# Patient Record
Sex: Male | Born: 1990 | Race: Black or African American | Hispanic: No | Marital: Single | Smoking: Never smoker
Health system: Southern US, Community
[De-identification: ages and names within clinical notes are randomized; demographics above are authoritative.]

---

## 2014-05-04 ENCOUNTER — Emergency Department (HOSPITAL_COMMUNITY)
Admission: EM | Admit: 2014-05-04 | Discharge: 2014-05-04 | Disposition: A | Discharge status: Home / Self Care | Payer: BLUE CROSS/BLUE SHIELD | Source: Home / Self Care | Attending: Family Medicine | Admitting: Family Medicine

## 2014-05-04 ENCOUNTER — Ambulatory Visit (HOSPITAL_COMMUNITY): Payer: BLUE CROSS/BLUE SHIELD | Attending: Family Medicine

## 2014-05-04 ENCOUNTER — Encounter (HOSPITAL_COMMUNITY): Payer: Self-pay | Admitting: Emergency Medicine

## 2014-05-04 DIAGNOSIS — M84375A Stress fracture, left foot, initial encounter for fracture: Secondary | ICD-10-CM

## 2014-05-04 DIAGNOSIS — M79673 Pain in unspecified foot: Secondary | ICD-10-CM | POA: Diagnosis not present

## 2014-05-04 NOTE — ED Provider Notes (Signed)
Jesus Taylor is a 24 y.o. male who presents to Urgent Care today for left foot pain and swelling. Patient has a week of pain and swelling of his left distal foot. Patient denies any injury or change in work at routine. He works at [employer] walking on a concrete floor all day. Pain is worse with activity and better with rest. He has not tried any medications yet. No fevers or chills.   History reviewed. No pertinent past medical history. History reviewed. No pertinent past surgical history. History  Substance Use Topics  . Smoking status: Never Smoker   . Smokeless tobacco: Not on file  . Alcohol Use: Yes     Comment: occaisonal    ROS as above Medications: No current facility-administered medications for this encounter.   No current outpatient prescriptions on file.   No Known Allergies   Exam:  BP 122/66 mmHg  Pulse 64  Temp(Src) 97.9 F (36.6 C) (Oral)  Resp 12  SpO2 100% Gen: Well NAD Left leg:  Knee is nontender with normal motion ankle nontender normal motion normal appearing Foot swollen and tender at the distal third and fourth metatarsals Pulses capillary refill sensation intact Motion intact.  Contralateral right leg Knee nontender normal motion and slight ankle nontender normal motion Foot nontender normal motion Pulses capillary refill sensation intact distally.  No results found for this or any previous visit (from the past 24 hour(s)). Dg Foot Complete Left  05/04/2014   CLINICAL DATA:  Visually shin pain anterior left foot for 1 week, no known injury, possible stress fracture metatarsal  EXAM: LEFT FOOT - COMPLETE 3+ VIEW  COMPARISON:  None.  FINDINGS: Third metatarsal shows periosteal reaction measuring about 7 mm in length, involving the medial aspect of the mid shaft of the third metatarsal. There is an extremely subtle linear lucency involving the medial cortex and extending into the shaft.  IMPRESSION: The findings are consistent with a nondisplaced  stress fracture of the third metatarsal shaft.   Electronically Signed   By: Esperanza Heiraymond  Rubner M.D.   On: 05/04/2014 12:03    Assessment and Plan: 24 y.o. male with third metatarsal stress fracture. Treat with postoperative shoe or steel shank. Follow up with sports medicine in about 2 weeks.  Discussed warning signs or symptoms. Please see discharge instructions. Patient expresses understanding.     Rodolph BongEvan S Emilo Gras, MD 05/04/14 1225

## 2014-05-04 NOTE — ED Notes (Signed)
Pt states that his left foot has been hurting, pt denies any injury or fall.

## 2014-05-04 NOTE — ED Notes (Signed)
Discussed discharge instructions with pt. Pt verbalized understanding of all discharge information.

## 2014-05-04 NOTE — Discharge Instructions (Signed)
Thank you for coming in today. Got get Turf Toe Full Steel Insole for your boots at work. [email or web address]-toe-full-steel-insole.html Follow up with Dr. Katrinka Blazing in about 2 weeks.  Return as needed.   Metatarsal Stress Fracture A stress fracture is a break in a bone of the body that is caused by repeated stress (trauma) that slowly weakens the bone until it eventually breaks. The metatarsal bones are in the middle of the feet, connecting the toes to the ankle. They are vulnerable to stress fractures. Metatarsal stress fractures are the second most common type of stress fracture in athletes. The metatarsal of the pointer toe (second metatarsal) is the most common metatarsal to suffer a stress fracture. SYMPTOMS   Vague, spread out pain or ache. Sometimes, tenderness and swelling in the foot.  Uncommonly, bleeding and bruising in the foot.  Weakness and inability to bear weight on the injured foot.  Paleness and deformity (sometimes). CAUSES  A stress fracture is caused by repeated trauma. This slowly weakens the bone, faster than it can heal itself, until the bone breaks. Stress fractures often follow a sudden change in training schedule. Stress fractures may be related to the loss of menstrual period in women.  RISK INCREASES WITH:   Previous stress fracture.  Sudden changes in training intensity, frequency, or duration [employer] conservation officer recruits, distance runners).  Bony abnormalities (osteoporosis, tumors).  Metabolism disorders or hormone problems.  Nutrition deficiencies or eating disorders (anorexia or bulimia).  The loss of or irregular menstrual periods in women.  Poor strength and flexibility.  Running on hard surfaces. Poor leg and foot alignment. This includes flat feet.  Poor footwear with poor shock absorbers.  Poor running technique. PREVENTION   Warm up and stretch properly before activity.  Maintain physical fitness:  Muscle strength.  Endurance  and flexibility.  Wear proper and correctly fitted footwear. Replace shoes after 300 to 500 miles of running.  Learn and use proper technique with training and activity.  Increase activity and training gradually.  Treat hormonal disorders. Birth control pills can be helpful for women with menstrual period irregularity.  Correct metabolism and nutrition disorders.  Wear cushioned arch supports for runners with flat feet. PROGNOSIS  With proper treatment, stress fractures usually heal within 6 to 12 weeks. RELATED COMPLICATIONS   Failure to heal (nonunion), especially with stress fractures of the outer foot (upper part of the fifth metatarsal).  Healing in a poor position (malunion).  Recurring stress fracture.  Progression to a complete or displaced fracture.  Risks of surgery: infection, bleeding, injury to nerves (numbness, weakness, paralysis), and need for further surgery.  Repeated stress fracture, not necessarily at the same site. (Occurs in 1 of every 10 patients). TREATMENT Treatment first involves ice and medicine to reduce pain and inflammation. You must rest from any aggravating activity, to avoid making the fracture worse. For severe stress fractures, crutches may be advised, to take weight off the injured foot. Depending on your caregiver's instructions, you may be permitted to perform activities that do not cause pain. Any menstrual, hormonal, or nutritional problems must be addressed and treated. Return to activity must be performed gradually, to avoid reinjuring the foot. Physical therapy may be advised, to help strengthen the foot and regain full function. On rare occasions, surgery is needed. This may be offered if non-surgical treatment is ineffective after 3 to 6 months. MEDICATION   If pain medicine is needed, nonsteroidal anti-inflammatory medicines (NSAIDS) or other minor pain relievers are often advised.  Do  not take pain medicine for 7 days before  surgery.  Only take over-the-counter or prescription medicines for pain, discomfort, or fever as directed by your caregiver. SEEK IMMEDIATE MEDICAL CARE IF:  Symptoms get worse or do not improve in 2 weeks, despite treatment. The following occur after immobilization or surgery:  Swelling above or below the fracture site.  Severe, persistent pain.  Blue or gray skin below the fracture site, especially under the toenails. Numbness or loss of feeling below the fracture site.  New, unexplained symptoms develop. (Drugs used in treatment may produce side effects.) Document Released: 02/10/2005 Document Revised: 05/05/2011 Document Reviewed: 05/25/2008 [facility] Patient Information 2015 [employer]. This information is not intended to replace advice given to you by your health care provider. Make sure you discuss any questions you have with your health care provider.

## 2014-05-25 ENCOUNTER — Ambulatory Visit: Payer: BLUE CROSS/BLUE SHIELD | Admitting: Family Medicine

## 2014-05-31 ENCOUNTER — Ambulatory Visit: Payer: BLUE CROSS/BLUE SHIELD | Admitting: Family Medicine

## 2014-06-19 ENCOUNTER — Other Ambulatory Visit (INDEPENDENT_AMBULATORY_CARE_PROVIDER_SITE_OTHER): Payer: BLUE CROSS/BLUE SHIELD

## 2014-06-19 ENCOUNTER — Ambulatory Visit (INDEPENDENT_AMBULATORY_CARE_PROVIDER_SITE_OTHER): Payer: BLUE CROSS/BLUE SHIELD | Admitting: Family Medicine

## 2014-06-19 ENCOUNTER — Encounter: Payer: Self-pay | Admitting: Family Medicine

## 2014-06-19 VITALS — BP 100/70 | HR 64 | Ht 69.0 in | Wt 149.0 lb

## 2014-06-19 DIAGNOSIS — M84375A Stress fracture, left foot, initial encounter for fracture: Secondary | ICD-10-CM | POA: Diagnosis not present

## 2014-06-19 DIAGNOSIS — M79672 Pain in left foot: Secondary | ICD-10-CM | POA: Diagnosis not present

## 2014-06-19 NOTE — Progress Notes (Signed)
[facility] [address] [place], [place] [address] Phone: [phone] Subjective:    CC: foot pain  Jesus Taylor is a 24 y.o. male coming in with complaint of patient was seen 6 weeks ago in urgent care and was diagnosed with a left foot metatarsal stress fracture. As seen on x-ray of the third metatarsal.patient was put in a postop boot and discussed a follow-up. Patient states since then has made some moderate improvement. Patient will continue to work as well as continue to do the exercises. Patient denies any numbness or [place]. Still has some mild soreness at the end of long days but nothing like it was previously. No swelling. Patient states that he feels that the pain should be completely better at this time. Patient rates the severity of pain is 3 out of 10. Denies any nighttime awakening.  No past medical history on file. No past surgical history on file. No Known Allergies History  Substance Use Topics  . Smoking status: Never Smoker   . Smokeless tobacco: Not on file  . Alcohol Use: Yes     Comment: occaisonal    No family history on file.      Past medical history, social, surgical and family history all reviewed in electronic medical record.   Review of Systems: No headache, visual changes, nausea, vomiting, diarrhea, constipation, dizziness, abdominal pain, skin rash, fevers, chills, night sweats, weight loss, swollen lymph nodes, body aches, joint swelling, muscle aches, chest pain, shortness of breath, mood changes.   Objective Blood pressure 100/70, pulse 64, height  (1.753 m), weight 149 lb (67.586 kg), SpO2 99 %.  General: No apparent distress alert and oriented x3 mood and affect normal, dressed appropriately.  HEENT: Pupils equal, extraocular movements intact  Respiratory: Patient's speak in full sentences and does not appear short of breath  Cardiovascular: No lower extremity edema, non tender, no  erythema  Skin: Warm dry intact with no signs of infection or rash on extremities or on axial skeleton.  Abdomen: Soft nontender  Neuro: Cranial nerves II through XII are intact, neurovascularly intact in all extremities with 2+ DTRs and 2+ pulses.  Lymph: No lymphadenopathy of posterior or anterior cervical chain or axillae bilaterally.  Gait normal with good balance and coordination.  MSK:  Non tender with full range of motion and good stability and symmetric strength and tone of shoulders, elbows, wrist, hip, knees bilaterally.  Ankle:left No visible erythema or swelling. Range of motion is full in all directions. Strength is 5/5 in all directions. Stable lateral and medial ligaments; squeeze test and kleiger test unremarkable; Talar dome nontender; No pain at base of 5th MT; No tenderness over cuboid; No tenderness over N spot or navicular prominence No tenderness on posterior aspects of lateral and medial malleolus No sign of peroneal tendon subluxations or tenderness to palpation Negative tarsal tunnel tinel's Able to walk 4 steps. Foot exam shows the patient does have a Morton's toe. Patient does have breakdown with splaying between the second and third toes. Patient is minimally tender to palpation over the left third metatarsal distally. Neurovascular intact distally.  MSK US performed QI:ONGE This study was ordered, performed, and interpreted by Jesus Taylor D.O.  Foot/Ankle:   All structures visualized.   Talar dome unremarkable  Ankle mortise without effusion. Peroneus longus and brevis tendons unremarkable on long and transverse views without sheath effusions. Posterior tibialis, flexor hallucis longus, and flexor digitorum longus tendons unremarkable on  long and transverse views without sheath effusions. Achilles tendon visualized along length of tendon and unremarkable on long and transverse views without sheath effusion. Anterior Talofibular Ligament and Calcaneofibular  Ligaments unremarkable and intact. Deltoid Ligament unremarkable and intact. Plantar fascia intact and without effusion, normal thickness. No increased doppler signal, cap sign, or thickening of tibial cortex. Power doppler signal normal. Third metatarsal on left foot does still have some mild callus formation. Patient does have hypoechoic changes and increasing Doppler flow.  IMPRESSION:  Continued third metatarsal stress fracture with some interval healing less than what would be appreciated     Impression and Recommendations:     This case required medical decision making of moderate complexity.

## 2014-06-19 NOTE — Assessment & Plan Note (Signed)
HEP, icing, discussed breakdown of foot and what to avoid, change workout routine and avoid excessive standing when possible.  RTC in 3 weeks.

## 2014-06-19 NOTE — Progress Notes (Signed)
Pre visit review using our clinic review tool, if applicable. No additional management support is needed unless otherwise documented below in the visit note. 

## 2014-06-19 NOTE — Patient Instructions (Signed)
Good to see you.  Ice 20 minutes 2 times daily. Usually after activity and before bed. Exercises 3 times a week to keep foot good Spenco orthotic looking for "total support" Rigid sole shoe daily, no barefoot Vitamin D 4000 IU daily for next 2 weeks then 2000 IU daily Ok to bike and elliptical no jumping, ok to lift.  See me again in 3 weeks to make sure completely good.

## 2014-07-10 ENCOUNTER — Ambulatory Visit (INDEPENDENT_AMBULATORY_CARE_PROVIDER_SITE_OTHER): Payer: BLUE CROSS/BLUE SHIELD | Admitting: Family Medicine

## 2014-07-10 ENCOUNTER — Encounter: Payer: Self-pay | Admitting: Family Medicine

## 2014-07-10 VITALS — BP 104/64 | HR 55 | Ht 69.0 in | Wt 148.0 lb

## 2014-07-10 DIAGNOSIS — M84375D Stress fracture, left foot, subsequent encounter for fracture with routine healing: Secondary | ICD-10-CM | POA: Diagnosis not present

## 2014-07-10 NOTE — Progress Notes (Signed)
  Tawana ScaleZach Smith D.O.  [facility] [address] [place], [place] [address] Phone: [phone] Subjective:    CC: foot pain  ZSW:[id number]:Subjective Jesus Taylor is a 24 y.o. male coming in with complaint of patient was seen 6 weeks ago in urgent care and was diagnosed with a left foot metatarsal stress fracture. As seen on x-ray of the third metatarsal.patient was put in a postop boot and discussed a follow-up.  Patient states now he is feeling much better. Patient has been taking the vitamin D supplementation. Patient has been wearing regular shoes and feeling much better. Patient actually states that he is having no pain. He would state 100  percent better.  No past medical history on file. No past surgical history on file. No Known Allergies History  Substance Use Topics  . Smoking status: Never Smoker   . Smokeless tobacco: Not on file  . Alcohol Use: Yes     Comment: occaisonal    No family history on file.      Past medical history, social, surgical and family history all reviewed in electronic medical record.   Review of Systems: No headache, visual changes, nausea, vomiting, diarrhea, constipation, dizziness, abdominal pain, skin rash, fevers, chills, night sweats, weight loss, swollen lymph nodes, body aches, joint swelling, muscle aches, chest pain, shortness of breath, mood changes.   Objective Blood pressure 104/64, pulse 55, height 5\' 9"  (1.753 m), weight 148 lb (67.132 kg), SpO2 98 %.  General: No apparent distress alert and oriented x3 mood and affect normal, dressed appropriately.  HEENT: Pupils equal, extraocular movements intact  Respiratory: Patient's speak in full sentences and does not appear short of breath  Cardiovascular: No lower extremity edema, non tender, no erythema  Skin: Warm dry intact with no signs of infection or rash on extremities or on axial skeleton.  Abdomen: Soft nontender  Neuro: Cranial nerves II through XII are intact,  neurovascularly intact in all extremities with 2+ DTRs and 2+ pulses.  Lymph: No lymphadenopathy of posterior or anterior cervical chain or axillae bilaterally.  Gait normal with good balance and coordination.  MSK:  Non tender with full range of motion and good stability and symmetric strength and tone of shoulders, elbows, wrist, hip, knees bilaterally.  Ankle:left No visible erythema or swelling. Range of motion is full in all directions. Strength is 5/5 in all directions. Stable lateral and medial ligaments; squeeze test and kleiger test unremarkable; Talar dome nontender; No pain at base of 5th MT; No tenderness over cuboid; No tenderness over N spot or navicular prominence No tenderness on posterior aspects of lateral and medial malleolus No sign of peroneal tendon subluxations or tenderness to palpation Negative tarsal tunnel tinel's Able to walk 4 steps. Foot exam shows the patient does have a Morton's toe. Patient does have breakdown with splaying between the second and third toes. nontender on exam. Neurovascular intact distally.       Impression and Recommendations:     This case required medical decision making of moderate complexity.

## 2014-07-10 NOTE — Patient Instructions (Signed)
Verbal instructions given

## 2014-07-10 NOTE — Assessment & Plan Note (Signed)
Healed at this time, no restrictions, follow-up as needed.

## 2017-01-22 ENCOUNTER — Encounter (HOSPITAL_COMMUNITY): Payer: Self-pay | Admitting: Emergency Medicine

## 2017-01-22 ENCOUNTER — Other Ambulatory Visit: Payer: Self-pay

## 2017-01-22 ENCOUNTER — Ambulatory Visit (HOSPITAL_COMMUNITY)
Admission: EM | Admit: 2017-01-22 | Discharge: 2017-01-22 | Disposition: A | Discharge status: Home / Self Care | Payer: BLUE CROSS/BLUE SHIELD | Attending: Physician Assistant | Admitting: Physician Assistant

## 2017-01-22 DIAGNOSIS — R42 Dizziness and giddiness: Secondary | ICD-10-CM

## 2017-01-22 DIAGNOSIS — K219 Gastro-esophageal reflux disease without esophagitis: Secondary | ICD-10-CM

## 2017-01-22 MED ORDER — OMEPRAZOLE 20 MG PO CPDR: 40.0000 mg | DELAYED_RELEASE_CAPSULE | Freq: Every day | ORAL | 0 refills | Status: DC

## 2017-01-22 NOTE — ED Triage Notes (Signed)
Pt also stated he had an episode of lightheadeness and tingling in L hand. Pt denies these symptoms at present.

## 2017-01-22 NOTE — ED Triage Notes (Signed)
Pt c/o acid reflux x2-3 weeks. Pt took some OTC med for it today and its not hurting as bad.

## 2017-01-22 NOTE — ED Provider Notes (Signed)
[facility]    CSN: [id number] Arrival date & time: 01/22/17  1943     History   Chief Complaint Chief Complaint  Patient presents with  . Gastroesophageal Reflux  . Dizziness    HPI Jesus Taylor is a 26 y.o. male.   26 year-old male, presenting today to acid reflux. States that over the past 2-3 days, he has had "gas" in his stomach that travels up into his chest. States that this worsens after eating things with tomato in it such as pizza, spaghetti, meatloaf. Today, he took left over prilosec with mild relief.  States that with the onset of his symptoms, he had bubbling in his stomach that caused chest pain. He called 911 because he thought he was having a "mild heart attack". These symptoms lasted a few minutes and then resolved    The history is provided by the patient.  Gastroesophageal Reflux  This is a new problem. The current episode started more than 1 week ago. The problem occurs daily. The problem has not changed since onset.Pertinent negatives include no chest pain, no abdominal pain and no shortness of breath. Exacerbated by: foods with tomato in them  Relieved by: prilosec. Treatments tried: prilosec. The treatment provided mild relief.    History reviewed. No pertinent past medical history.  Patient Active Problem List   Diagnosis Date Noted  . Metatarsal stress fracture of left foot 06/19/2014    History reviewed. No pertinent surgical history.     Home Medications    Prior to Admission medications   Medication Sig Start Date End Date Taking? Authorizing Provider  omeprazole (PRILOSEC) 20 MG capsule Take 2 capsules (40 mg total) by mouth daily before breakfast. 01/22/17 02/21/17  Matalynn Graff, Marylene Landlivia C, PA-C    Family History No family history on file.  Social History Social History   Tobacco Use  . Smoking status: Never Smoker  Substance Use Topics  . Alcohol use: Yes    Comment: occaisonal   . Drug use: No     Allergies     Patient has no known allergies.   Review of Systems Review of Systems  Constitutional: Negative for chills and fever.  HENT: Negative for ear pain and sore throat.   Eyes: Negative for pain and visual disturbance.  Respiratory: Negative for cough and shortness of breath.   Cardiovascular: Negative for chest pain and palpitations.  Gastrointestinal: Negative for abdominal pain and vomiting.       Acid reflux  Genitourinary: Negative for dysuria and hematuria.  Musculoskeletal: Negative for arthralgias and back pain.  Skin: Negative for color change and rash.  Neurological: Negative for seizures and syncope.  All other systems reviewed and are negative.    Physical Exam Triage Vital Signs ED Triage Vitals  Enc Vitals Group     BP 01/22/17 1955 131/65     Pulse Rate 01/22/17 1955 66     Resp 01/22/17 1955 16     Temp 01/22/17 1955 (!) 97.4 F (36.3 C)     Temp Source 01/22/17 1955 Oral     SpO2 01/22/17 1955 100 %     Weight --      Height --      Head Circumference --      Peak Flow --      Pain Score 01/22/17 1958 0     Pain Loc --      Pain Edu? --      Excl. in GC? --  No data found.  Updated Vital Signs BP 131/65   Pulse 66   Temp (!) 97.4 F (36.3 C) (Oral) Comment: pt drinking water  Resp 16   SpO2 100%   Visual Acuity Right Eye Distance:   Left Eye Distance:   Bilateral Distance:    Right Eye Near:   Left Eye Near:    Bilateral Near:     Physical Exam  Constitutional: He appears well-developed and well-nourished.  HENT:  Head: Normocephalic and atraumatic.  Eyes: Conjunctivae are normal.  Neck: Neck supple.  Cardiovascular: Normal rate, regular rhythm and normal pulses.  No extrasystoles are present. PMI is not displaced. Exam reveals no decreased pulses.  No murmur heard. Pulmonary/Chest: Effort normal and breath sounds normal. No respiratory distress.  Abdominal: Soft. There is no tenderness.  Musculoskeletal: He exhibits no edema.   Neurological: He is alert.  Skin: Skin is warm and dry.  Psychiatric: He has a normal mood and affect.  Nursing note and vitals reviewed.    UC Treatments / Results  Labs (all labs ordered are listed, but only abnormal results are displayed) Labs Reviewed - No data to display  EKG  EKG Interpretation None       Radiology No results found.  Procedures Procedures (including critical care time)  Medications Ordered in UC Medications - No data to display   Initial Impression / Assessment and Plan / UC Course  I have reviewed the triage vital signs and the nursing notes.  Pertinent labs & imaging results that were available during my care of the patient were reviewed by me and considered in my medical decision making (see chart for details).     Normal EKG with NSR Symptoms consistent with reflux Plan for 40 mg prilosec every morning 30 minutes before breakfast and pepcid at night. Avoid triggers  GI referral if this does not improve symptoms  Final Clinical Impressions(s) / UC Diagnoses   Final diagnoses:  Gastroesophageal reflux disease, esophagitis presence not specified    ED Discharge Orders        Ordered    omeprazole (PRILOSEC) 20 MG capsule  Daily before breakfast     01/22/17 2011       Controlled Substance Prescriptions [place] Controlled Substance Registry consulted? Not Applicable   Alecia LemmingBlue, Stephano Arrants C, [place]PA-C 01/22/17 2016

## 2017-03-01 ENCOUNTER — Emergency Department (HOSPITAL_COMMUNITY): Payer: BLUE CROSS/BLUE SHIELD

## 2017-03-01 ENCOUNTER — Encounter (HOSPITAL_COMMUNITY): Payer: Self-pay

## 2017-03-01 ENCOUNTER — Emergency Department (HOSPITAL_COMMUNITY)
Admission: EM | Admit: 2017-03-01 | Discharge: 2017-03-01 | Disposition: A | Discharge status: Home / Self Care | Payer: BLUE CROSS/BLUE SHIELD | Attending: Emergency Medicine | Admitting: Emergency Medicine

## 2017-03-01 DIAGNOSIS — R0789 Other chest pain: Secondary | ICD-10-CM | POA: Insufficient documentation

## 2017-03-01 DIAGNOSIS — Z79899 Other long term (current) drug therapy: Secondary | ICD-10-CM | POA: Insufficient documentation

## 2017-03-01 DIAGNOSIS — K2900 Acute gastritis without bleeding: Secondary | ICD-10-CM

## 2017-03-01 DIAGNOSIS — K2930 Chronic superficial gastritis without bleeding: Secondary | ICD-10-CM | POA: Diagnosis not present

## 2017-03-01 DIAGNOSIS — K224 Dyskinesia of esophagus: Secondary | ICD-10-CM | POA: Diagnosis not present

## 2017-03-01 DIAGNOSIS — R079 Chest pain, unspecified: Secondary | ICD-10-CM | POA: Diagnosis not present

## 2017-03-01 DIAGNOSIS — K2960 Other gastritis without bleeding: Secondary | ICD-10-CM | POA: Diagnosis not present

## 2017-03-01 LAB — CBC
HCT: 41.3 % (ref 39.0–52.0)
Hemoglobin: 13.8 g/dL (ref 13.0–17.0)
MCH: 27.5 pg (ref 26.0–34.0)
MCHC: 33.4 g/dL (ref 30.0–36.0)
MCV: 82.3 fL (ref 78.0–100.0)
Platelets: 288 10*3/uL (ref 150–400)
RBC: 5.02 MIL/uL (ref 4.22–5.81)
RDW: 13 % (ref 11.5–15.5)
WBC: 13.1 10*3/uL — ABNORMAL HIGH (ref 4.0–10.5)

## 2017-03-01 LAB — I-STAT TROPONIN, ED
TROPONIN I, POC: 0 ng/mL (ref 0.00–0.08)
Troponin i, poc: 0 ng/mL (ref 0.00–0.08)

## 2017-03-01 LAB — HEPATIC FUNCTION PANEL
ALK PHOS: 45 U/L (ref 38–126)
ALT: 57 U/L (ref 17–63)
AST: 52 U/L — ABNORMAL HIGH (ref 15–41)
Albumin: 4.2 g/dL (ref 3.5–5.0)
BILIRUBIN TOTAL: 1 mg/dL (ref 0.3–1.2)
Total Protein: 7.2 g/dL (ref 6.5–8.1)

## 2017-03-01 LAB — BASIC METABOLIC PANEL
Anion gap: 9 (ref 5–15)
BUN: 10 mg/dL (ref 6–20)
CALCIUM: 9.1 mg/dL (ref 8.9–10.3)
CO2: 24 mmol/L (ref 22–32)
Chloride: 104 mmol/L (ref 101–111)
Creatinine, Ser: 1.06 mg/dL (ref 0.61–1.24)
GFR calc non Af Amer: >60 mL/min (ref >60)
Glucose, Bld: 87 mg/dL (ref 65–99)
Potassium: 4.1 mmol/L (ref 3.5–5.1)
Sodium: 137 mmol/L (ref 135–145)

## 2017-03-01 LAB — LIPASE, BLOOD: LIPASE: 44 U/L (ref 11–51)

## 2017-03-01 MED ORDER — GI COCKTAIL ~~LOC~~
30.0000 mL | Freq: Once | ORAL | Status: AC
  Administered 2017-03-01: 30 mL via ORAL
  Filled 2017-03-01: qty 30

## 2017-03-01 MED ORDER — RANITIDINE HCL 150 MG PO TABS: 150.0000 mg | ORAL_TABLET | Freq: Two times a day (BID) | ORAL | 0 refills | Status: DC

## 2017-03-01 MED ORDER — LORAZEPAM 2 MG/ML IJ SOLN
1.0000 mg | Freq: Once | INTRAMUSCULAR | Status: AC
  Administered 2017-03-01: 1 mg via INTRAVENOUS
  Filled 2017-03-01: qty 1

## 2017-03-01 MED ORDER — SUCRALFATE 1 G PO TABS: 1.0000 g | ORAL_TABLET | Freq: Three times a day (TID) | ORAL | 0 refills | Status: DC

## 2017-03-01 MED ORDER — FAMOTIDINE IN NACL 20-0.9 MG/50ML-% IV SOLN
20.0000 mg | Freq: Once | INTRAVENOUS | Status: AC
  Administered 2017-03-01: 20 mg via INTRAVENOUS
  Filled 2017-03-01: qty 50

## 2017-03-01 NOTE — ED Provider Notes (Signed)
[facility] EMERGENCY DEPARTMENT Provider Note   CSN: [id number] Arrival date & time: 03/01/17  1217     History   Chief Complaint Chief Complaint  Patient presents with  . Chest Pain    HPI Jesus Taylor is a 27 y.o. male.  HPI   27 year old male with past medical history as below who presents with intermittent chest pain.  The patient states that over the last month, he has had increasingly frequent episodes of burning, aching, pressure-like chest pain.  This occasionally radiates up towards his throat as well as his left shoulder.  He states that he was put on an antacid which mildly improved, but earlier today he had a severe episode.  He had associated shortness of breath.  He felt like he could not breathe and then began feeling numb in his upper extremities.  Denies any vomiting but did have some nausea.  This occurred after eating.  He also went out drinking last night.  He also ate fried food right before bed.  He denies any known cardiac history.  No significant family history of cardiac disease.  Denies history of hypertension or hyperlipidemia.  No recent travel or leg swelling.  Pain is worse with eating and lying flat.  History reviewed. No pertinent past medical history.  Patient Active Problem List   Diagnosis Date Noted  . Metatarsal stress fracture of left foot 06/19/2014    History reviewed. No pertinent surgical history.     Home Medications    Prior to Admission medications   Medication Sig Start Date End Date Taking? Authorizing Provider  omeprazole (PRILOSEC) 20 MG capsule Take 2 capsules (40 mg total) by mouth daily before breakfast. 01/22/17 02/21/17  Blue, Olivia C, PA-C  ranitidine (ZANTAC) 150 MG tablet Take 1 tablet (150 mg total) by mouth 2 (two) times daily for 10 days. 03/01/17 03/11/17  Shaune Pollack, MD  sucralfate (CARAFATE) 1 g tablet Take 1 tablet (1 g total) by mouth 4 (four) times daily -  with meals and at bedtime  for 10 days. 03/01/17 03/11/17  Shaune Pollack, MD    Family History No family history on file.  Social History Social History   Tobacco Use  . Smoking status: Never Smoker  Substance Use Topics  . Alcohol use: Yes    Comment: occaisonal   . Drug use: No     Allergies   Patient has no known allergies.   Review of Systems Review of Systems  Constitutional: Positive for fatigue.  Respiratory: Positive for chest tightness.   Gastrointestinal: Positive for abdominal pain and nausea.  All other systems reviewed and are negative.    Physical Exam Updated Vital Signs BP 123/90   Pulse 75   Temp 98.8 F (37.1 C) (Oral)   Resp 15   Ht 5\' 9"  (1.753 m)   Wt 72.6 kg (160 lb)   SpO2 100%   BMI 23.63 kg/m   Physical Exam  Constitutional: He is oriented to person, place, and time. He appears well-developed and well-nourished. No distress.  HENT:  Head: Normocephalic and atraumatic.  Eyes: Conjunctivae are normal.  Neck: Neck supple.  Cardiovascular: Normal rate, regular rhythm and normal heart sounds. Exam reveals no friction rub.  No murmur heard. Pulmonary/Chest: Effort normal and breath sounds normal. No respiratory distress. He has no wheezes. He has no rales.  Abdominal: Soft. He exhibits no distension. There is tenderness (Mild, epigastric).  Musculoskeletal: He exhibits no edema.  Neurological: He  is alert and oriented to person, place, and time. He exhibits normal muscle tone.  Skin: Skin is warm. Capillary refill takes less than 2 seconds.  Psychiatric: He has a normal mood and affect.  Nursing note and vitals reviewed.    ED Treatments / Results  Labs (all labs ordered are listed, but only abnormal results are displayed) Labs Reviewed  CBC - Abnormal; Notable for the following components:      Result Value   WBC 13.1 (*)    All other components within normal limits  HEPATIC FUNCTION PANEL - Abnormal; Notable for the following components:   AST 52 (*)      Bilirubin, Direct <0.1 (*)    All other components within normal limits  BASIC METABOLIC PANEL  LIPASE, BLOOD  I-STAT TROPONIN, ED  I-STAT TROPONIN, ED    EKG  EKG Interpretation  Date/Time:  Sunday March 01 2017 12:22:12 EST Ventricular Rate:  73 PR Interval:    QRS Duration: 85 QT Interval:  388 QTC Calculation: 428 R Axis:   74 Text Interpretation:  Sinus rhythm No significant change since last tracing Confirmed by Toivo Bordon ([id number]) on 03/01/2017 2:20:09 PM       Radiology Dg Chest 2 View  Result Date: 03/01/2017 CLINICAL DATA:  Central chest tightness, pain radiating down LEFT arm today, history GERD EXAM: CHEST  2 VIEW COMPARISON:  None FINDINGS: Normal heart size, mediastinal contours, and pulmonary vascularity. Eventration of posterior LEFT diaphragm noted. Lungs clear. No pleural effusion or pneumothorax. Bones unremarkable. IMPRESSION: No acute abnormalities. Electronically Signed   By: Mark  Boles M.D.   On: 03/01/2017 14:15    Procedures Procedures (including critical care time)  Medications Ordered in ED Medications  gi cocktail (Maalox,Lidocaine,Donnatal) (30 mLs Oral Given 03/01/17 1344)  LORazepam (ATIVAN) injection 1 mg (1 mg Intravenous Given 03/01/17 1421)  famotidine (PEPCID) IVPB 20 mg premix (0 mg Intravenous Stopped 03/01/17 1650)     Initial Impression / Assessment and Plan / ED Course  I have reviewed the triage vital signs and the nursing notes.  Pertinent labs & imaging results that were available during my care of the patient were reviewed by me and considered in my medical decision making (see chart for details).     26  year old male with no significant past medical history here with intermittent burning epigastric pain.  He has associated occasional shortness of breath.  Patient symptoms are highly consistent with likely GI related etiology.  They seem to be related to eating and worsen after eating as well as drinking alcohol.  Lipase and  LFTs are largely unremarkable.  He has mild AST elevation which is likely secondary to his heavy alcohol use yesterday.  He has no evidence of cirrhosis or liver failure on exam.  I do not suspect ACS and he has a normal EKG with serial negative troponins and heart score less than 3.  No lower extremity swelling, tachycardia, hypoxia, or signs of PE or aortic dissection.  Patient feels markedly improved following treatment here in the ED.  Will place him on additional Carafate and Zantac, and refer to GI.  There may also be a component of anxiety but will elect for treatment of GI etiology at this time.  This note was prepared with assistance of Conservation officer, historic buildingsDragon voice recognition software. Occasional wrong-word or sound-a-like substitutions may have occurred due to the inherent limitations of voice recognition software.   Final Clinical Impressions(s) / ED Diagnoses   Final diagnoses:  Atypical chest  pain  Acute superficial gastritis without hemorrhage  Esophageal spasm    ED Discharge Orders        Ordered    sucralfate (CARAFATE) 1 g tablet  3 times daily with meals & bedtime     03/01/17 1643    ranitidine (ZANTAC) 150 MG tablet  2 times daily     03/01/17 1643       Shaune Pollack, MD 03/01/17 1708

## 2017-03-01 NOTE — ED Triage Notes (Signed)
Pt presents for evaluation of central chest "tightness" and radiation down L arm today. Pt recently dx with GERD and taking omeprazole, supposed to follow up with GI for endoscopy.

## 2017-03-19 ENCOUNTER — Ambulatory Visit: Payer: BLUE CROSS/BLUE SHIELD | Admitting: Family Medicine

## 2017-03-19 DIAGNOSIS — K2900 Acute gastritis without bleeding: Secondary | ICD-10-CM | POA: Diagnosis not present

## 2017-03-31 ENCOUNTER — Ambulatory Visit (HOSPITAL_COMMUNITY)
Admission: EM | Admit: 2017-03-31 | Discharge: 2017-03-31 | Discharge status: Against Medical Advice | Payer: BLUE CROSS/BLUE SHIELD

## 2017-03-31 NOTE — ED Triage Notes (Signed)
PT reports he is feeling better and leaves from lobby.

## 2017-04-21 ENCOUNTER — Other Ambulatory Visit: Payer: Self-pay | Admitting: Internal Medicine

## 2017-04-21 DIAGNOSIS — K219 Gastro-esophageal reflux disease without esophagitis: Secondary | ICD-10-CM

## 2017-04-22 ENCOUNTER — Other Ambulatory Visit: Payer: Self-pay

## 2017-04-22 ENCOUNTER — Emergency Department (HOSPITAL_BASED_OUTPATIENT_CLINIC_OR_DEPARTMENT_OTHER)
Admission: EM | Admit: 2017-04-22 | Discharge: 2017-04-23 | Disposition: A | Discharge status: Home / Self Care | Payer: BLUE CROSS/BLUE SHIELD | Attending: Emergency Medicine | Admitting: Emergency Medicine

## 2017-04-22 ENCOUNTER — Emergency Department (HOSPITAL_BASED_OUTPATIENT_CLINIC_OR_DEPARTMENT_OTHER): Payer: BLUE CROSS/BLUE SHIELD

## 2017-04-22 ENCOUNTER — Encounter (HOSPITAL_BASED_OUTPATIENT_CLINIC_OR_DEPARTMENT_OTHER): Payer: Self-pay

## 2017-04-22 DIAGNOSIS — K219 Gastro-esophageal reflux disease without esophagitis: Secondary | ICD-10-CM | POA: Insufficient documentation

## 2017-04-22 DIAGNOSIS — R0789 Other chest pain: Secondary | ICD-10-CM

## 2017-04-22 LAB — CBC WITH DIFFERENTIAL/PLATELET
BASOS ABS: 0.1 10*3/uL (ref 0.0–0.1)
BASOS PCT: 1 %
EOS PCT: 2 %
Eosinophils Absolute: 0.2 10*3/uL (ref 0.0–0.7)
HCT: 39 % (ref 39.0–52.0)
Hemoglobin: 13.4 g/dL (ref 13.0–17.0)
Lymphocytes Relative: 30 %
Lymphs Abs: 3 10*3/uL (ref 0.7–4.0)
MCH: 27.6 pg (ref 26.0–34.0)
MCHC: 34.4 g/dL (ref 30.0–36.0)
MCV: 80.4 fL (ref 78.0–100.0)
MONO ABS: 1 10*3/uL (ref 0.1–1.0)
Monocytes Relative: 9 %
Neutro Abs: 6.1 10*3/uL (ref 1.7–7.7)
Neutrophils Relative %: 58 %
PLATELETS: 275 10*3/uL (ref 150–400)
RBC: 4.85 MIL/uL (ref 4.22–5.81)
RDW: 12.7 % (ref 11.5–15.5)
WBC: 10.3 10*3/uL (ref 4.0–10.5)

## 2017-04-22 MED ORDER — FAMOTIDINE IN NACL 20-0.9 MG/50ML-% IV SOLN
20.0000 mg | Freq: Once | INTRAVENOUS | Status: AC
  Administered 2017-04-22: 20 mg via INTRAVENOUS
  Filled 2017-04-22: qty 50

## 2017-04-22 MED ORDER — GI COCKTAIL ~~LOC~~
30.0000 mL | Freq: Once | ORAL | Status: AC
  Administered 2017-04-22: 30 mL via ORAL
  Filled 2017-04-22: qty 30

## 2017-04-22 MED ORDER — LORAZEPAM 2 MG/ML IJ SOLN
1.0000 mg | Freq: Once | INTRAMUSCULAR | Status: AC
  Administered 2017-04-22: 1 mg via INTRAVENOUS
  Filled 2017-04-22: qty 1

## 2017-04-22 NOTE — ED Notes (Signed)
Pt has several pepcid and zantac in med list-states he is not taking either-has rx nexium but did not take today

## 2017-04-22 NOTE — ED Triage Notes (Signed)
Pt states he is having "esophagus spasms" and SOB since 8pm-reports hx of same and seen in ED for same-NAD-steady gait

## 2017-04-23 LAB — TROPONIN I

## 2017-04-23 LAB — COMPREHENSIVE METABOLIC PANEL
ALK PHOS: 50 U/L (ref 38–126)
ALT: 27 U/L (ref 17–63)
ANION GAP: 9 (ref 5–15)
AST: 28 U/L (ref 15–41)
Albumin: 4.7 g/dL (ref 3.5–5.0)
BILIRUBIN TOTAL: 0.8 mg/dL (ref 0.3–1.2)
BUN: 11 mg/dL (ref 6–20)
CALCIUM: 9.1 mg/dL (ref 8.9–10.3)
CO2: 22 mmol/L (ref 22–32)
Chloride: 104 mmol/L (ref 101–111)
Creatinine, Ser: 1.21 mg/dL (ref 0.61–1.24)
Glucose, Bld: 88 mg/dL (ref 65–99)
Potassium: 3.3 mmol/L — ABNORMAL LOW (ref 3.5–5.1)
Sodium: 135 mmol/L (ref 135–145)
TOTAL PROTEIN: 7.6 g/dL (ref 6.5–8.1)

## 2017-04-23 LAB — LIPASE, BLOOD: Lipase: 45 U/L (ref 11–51)

## 2017-04-23 NOTE — Discharge Instructions (Signed)
Take omeprazole as directed.  Use Carafate as needed.  As we discussed, you need to follow-up with a GI doctor regarding her symptoms.  Return to the emergency department for any worsening pain, fever, difficulty breathing, nausea/vomiting or any other worsening or concerning symptoms.

## 2017-04-23 NOTE — ED Provider Notes (Signed)
[facility] EMERGENCY DEPARTMENT Provider Note   CSN: [id number] Arrival date & time: 04/22/17  2251     History   Chief Complaint Chief Complaint  Patient presents with  . Spasms    HPI Jesus Taylor is a 27 y.o. male with past medical history of GERD who presents for evaluation of midsternal chest discomfort/tightness.  Patient reports that he has a history of GERD and esophageal spasms.  He states that he is supposed be taking omeprazole, Pepcid.  Patient reports that he was drinking alcohol and states that he did not take his medications today.  Patient reports that approximately 8 PM this evening, he started experiencing some discomfort/tightness in the midsternal region of his chest.  Patient reports that the pain radiated up into his throat.  Patient also reports some epigastric discomfort.  He states that this is similar to previous episodes of esophageal spasms.  He did not take any medications prior to ED arrival.  Patient denies any associated nausea or diaphoresis.  He states that his pain is not worse with exertion or deep inspiration.  Patient states that he does not smoke cigarettes.  He denies any cocaine, heroin, marijuana use.  Patient denies any recent fevers, abdominal pain, vomiting, numbness/weakness of his arms or legs.  Patient denies any personal cardiac history.  He denies any family cardiac history.  The history is provided by the patient.    History reviewed. No pertinent past medical history.  Patient Active Problem List   Diagnosis Date Noted  . Metatarsal stress fracture of left foot 06/19/2014    History reviewed. No pertinent surgical history.     Home Medications    Prior to Admission medications   Medication Sig Start Date End Date Taking? Authorizing Provider  Esomeprazole Magnesium (NEXIUM PO) Take by mouth.   Yes [provider]  sucralfate (CARAFATE) 1 g tablet Take 1 tablet (1 g total) by mouth 4 (four) times daily  -  with meals and at bedtime for 10 days. 03/01/17 03/11/17  Shaune Pollack, MD    Family History No family history on file.  Social History Social History   Tobacco Use  . Smoking status: Never Smoker  . Smokeless tobacco: Never Used  Substance Use Topics  . Alcohol use: Yes    Comment: weekly  . Drug use: No     Allergies   Patient has no known allergies.   Review of Systems Review of Systems  Constitutional: Negative for diaphoresis and fever.  Respiratory: Negative for cough and shortness of breath.   Cardiovascular: Positive for chest pain.  Gastrointestinal: Positive for abdominal pain. Negative for nausea and vomiting.  Genitourinary: Negative for dysuria and hematuria.  Neurological: Negative for weakness, numbness and headaches.  All other systems reviewed and are negative.    Physical Exam Updated Vital Signs BP 121/88   Pulse 70   Temp (!) 97.4 F (36.3 C) (Oral)   Resp 18   Ht 5\' 9"  (1.753 m)   Wt 72.6 kg (160 lb)   SpO2 99%   BMI 23.63 kg/m   Physical Exam  Constitutional: He is oriented to person, place, and time. He appears well-developed and well-nourished.  Sitting comfortably on examination table  HENT:  Head: Normocephalic and atraumatic.  Mouth/Throat: Oropharynx is clear and moist and mucous membranes are normal.  Eyes: Conjunctivae, EOM and lids are normal. Pupils are equal, round, and reactive to light.  Neck: Full passive range of motion without pain.  Cardiovascular: Normal rate, regular rhythm, normal heart sounds and normal pulses. Exam reveals no gallop and no friction rub.  No murmur heard. Pulmonary/Chest: Effort normal and breath sounds normal.  No evidence of respiratory distress. Able to speak in full sentences without difficulty.  Abdominal: Soft. Normal appearance. There is no tenderness. There is no rigidity and no guarding.  Abdomen is soft, nondistended, nontender.  No tenderness noted to the epigastric region.    Musculoskeletal: Normal range of motion.  Neurological: He is alert and oriented to person, place, and time.  Skin: Skin is warm and dry. Capillary refill takes less than 2 seconds.  Psychiatric: He has a normal mood and affect. His speech is normal.  Nursing note and vitals reviewed.    ED Treatments / Results  Labs (all labs ordered are listed, but only abnormal results are displayed) Labs Reviewed  COMPREHENSIVE METABOLIC PANEL - Abnormal; Notable for the following components:      Result Value   Potassium 3.3 (*)    All other components within normal limits  LIPASE, BLOOD  CBC WITH DIFFERENTIAL/PLATELET  TROPONIN I  TROPONIN I    EKG  EKG Interpretation  Date/Time:  Wednesday April 22 2017 23:44:35 EST Ventricular Rate:  64 PR Interval:    QRS Duration: 90 QT Interval:  423 QTC Calculation: 437 R Axis:   71 Text Interpretation:  Sinus rhythm Artifact No significant change was found otherwise Confirmed by Paula Libra ([id number]) on 04/23/2017 12:06:11 AM       Radiology Dg Chest 2 View  Result Date: 04/23/2017 CLINICAL DATA:  27 year old male with cough EXAM: CHEST  2 VIEW COMPARISON:  Chest radiograph dated 03/01/2017 FINDINGS: Mild eventration of the left hemidiaphragm similar to prior study. No focal consolidation, pleural effusion, or pneumothorax. The cardiac silhouette is within normal limits. No acute osseous pathology. IMPRESSION: No active cardiopulmonary disease. Electronically Signed   By: Elgie Collard M.D.   On: 04/23/2017 00:18    Procedures Procedures (including critical care time)  Medications Ordered in ED Medications  gi cocktail (Maalox,Lidocaine,Donnatal) (30 mLs Oral Given 04/22/17 2344)  LORazepam (ATIVAN) injection 1 mg (1 mg Intravenous Given 04/22/17 2344)  famotidine (PEPCID) IVPB 20 mg premix (0 mg Intravenous Stopped 04/23/17 0026)     Initial Impression / Assessment and Plan / ED Course  I have reviewed the triage vital signs and  the nursing notes.  Pertinent labs & imaging results that were available during my care of the patient were reviewed by me and considered in my medical decision making (see chart for details).     27 y.o. M with PMH/o GERD who presents for evaluation of midsternal chest tightness.  Patient has a history of esophageal spasm and states that he started experiencing symptoms that were similar tonight.  He reports that he is supposed to be on a PPI but states that he was out drinking alcohol last night and did not take his medications today.  Reports pain is like a tightness on the anterior aspect of his chest that radiates up his throat.  He has also having some epigastric pain.  No associated nausea, diaphoresis, difficulty breathing.  Not worse with exertion or inspiration. Patient is afebrile, non-toxic appearing, sitting comfortably on examination table. Vital signs reviewed and stable.  Consider esophageal spasm versus GERD versus ACS etiology.  Do not suspect PE given history/physical exam.  Plan for basic labs, EKG, chest x-ray. Plan for analgesics.   Chest x-ray negative for any acute  abnormalities.  Initial troponin is negative.  Lipase unremarkable.  CMP shows slight hypokalemia but otherwise unremarkable.  CBC is without any acute abnormalities.  12:54 AM: Re-evaluation.  Patient reports improvement in pain after analgesics.  He is sleeping comfortably on the bed without any difficulties.  Based on presentation, risk factors, patient has a heart score of 1. Given history/physical exam, low suspicion for ACS etiology. I suspect this is most likely due ot patient's history of GERD and esophageal spasm. Plan to delta trop. If negative, plan to dispo home with GI folllow-up.   Patient signed out to Dr. Read DriversMolpus with delta trop pending.   Final Clinical Impressions(s) / ED Diagnoses   Final diagnoses:  Chest pain, atypical  Gastroesophageal reflux disease, esophagitis presence not specified     ED Discharge Orders    None       Maxwell CaulLayden, Jaxden Blyden A, PA-C 04/23/17 0156    Molpus, Jonny RuizJohn, MD 04/23/17 [id number]

## 2017-04-27 ENCOUNTER — Ambulatory Visit
Admission: RE | Admit: 2017-04-27 | Discharge: 2017-04-27 | Disposition: A | Discharge status: Home / Self Care | Payer: BLUE CROSS/BLUE SHIELD | Source: Ambulatory Visit | Attending: Internal Medicine | Admitting: Internal Medicine

## 2017-04-27 DIAGNOSIS — K219 Gastro-esophageal reflux disease without esophagitis: Secondary | ICD-10-CM

## 2017-05-18 ENCOUNTER — Encounter (HOSPITAL_COMMUNITY): Payer: Self-pay

## 2017-05-18 ENCOUNTER — Other Ambulatory Visit: Payer: Self-pay

## 2017-05-18 ENCOUNTER — Emergency Department (HOSPITAL_COMMUNITY): Payer: BLUE CROSS/BLUE SHIELD

## 2017-05-18 ENCOUNTER — Emergency Department (HOSPITAL_COMMUNITY)
Admission: EM | Admit: 2017-05-18 | Discharge: 2017-05-19 | Disposition: A | Discharge status: Home / Self Care | Payer: BLUE CROSS/BLUE SHIELD | Attending: Emergency Medicine | Admitting: Emergency Medicine

## 2017-05-18 DIAGNOSIS — R12 Heartburn: Secondary | ICD-10-CM | POA: Diagnosis not present

## 2017-05-18 DIAGNOSIS — F419 Anxiety disorder, unspecified: Secondary | ICD-10-CM | POA: Diagnosis not present

## 2017-05-18 DIAGNOSIS — R079 Chest pain, unspecified: Secondary | ICD-10-CM

## 2017-05-18 DIAGNOSIS — R0789 Other chest pain: Secondary | ICD-10-CM | POA: Insufficient documentation

## 2017-05-18 DIAGNOSIS — Z79899 Other long term (current) drug therapy: Secondary | ICD-10-CM | POA: Insufficient documentation

## 2017-05-18 LAB — BASIC METABOLIC PANEL
Anion gap: 14 (ref 5–15)
BUN: 7 mg/dL (ref 6–20)
CALCIUM: 9.8 mg/dL (ref 8.9–10.3)
CO2: 21 mmol/L — ABNORMAL LOW (ref 22–32)
Chloride: 105 mmol/L (ref 101–111)
Creatinine, Ser: 1.02 mg/dL (ref 0.61–1.24)
Glucose, Bld: 84 mg/dL (ref 65–99)
Potassium: 3.9 mmol/L (ref 3.5–5.1)
SODIUM: 140 mmol/L (ref 135–145)

## 2017-05-18 LAB — CBC
HCT: 45.1 % (ref 39.0–52.0)
HEMOGLOBIN: 15.4 g/dL (ref 13.0–17.0)
MCH: 27.6 pg (ref 26.0–34.0)
MCHC: 34.1 g/dL (ref 30.0–36.0)
MCV: 81 fL (ref 78.0–100.0)
PLATELETS: 445 10*3/uL — AB (ref 150–400)
RBC: 5.57 MIL/uL (ref 4.22–5.81)
RDW: 13.4 % (ref 11.5–15.5)
WBC: 14.9 10*3/uL — ABNORMAL HIGH (ref 4.0–10.5)

## 2017-05-18 LAB — I-STAT TROPONIN, ED
TROPONIN I, POC: 0 ng/mL (ref 0.00–0.08)
Troponin i, poc: 0 ng/mL (ref 0.00–0.08)

## 2017-05-18 LAB — HEPATIC FUNCTION PANEL
ALT: 20 U/L (ref 17–63)
AST: 29 U/L (ref 15–41)
Albumin: 4.9 g/dL (ref 3.5–5.0)
Alkaline Phosphatase: 63 U/L (ref 38–126)
BILIRUBIN DIRECT: 0.2 mg/dL (ref 0.1–0.5)
BILIRUBIN INDIRECT: 1.3 mg/dL — AB (ref 0.3–0.9)
TOTAL PROTEIN: 8.5 g/dL — AB (ref 6.5–8.1)
Total Bilirubin: 1.5 mg/dL — ABNORMAL HIGH (ref 0.3–1.2)

## 2017-05-18 LAB — LIPASE, BLOOD: LIPASE: 66 U/L — AB (ref 11–51)

## 2017-05-18 MED ORDER — SODIUM CHLORIDE 0.9 % IV BOLUS
500.0000 mL | Freq: Once | INTRAVENOUS | Status: AC
  Administered 2017-05-18: 500 mL via INTRAVENOUS

## 2017-05-18 MED ORDER — FAMOTIDINE IN NACL 20-0.9 MG/50ML-% IV SOLN
20.0000 mg | Freq: Once | INTRAVENOUS | Status: AC
  Administered 2017-05-18: 20 mg via INTRAVENOUS
  Filled 2017-05-18: qty 50

## 2017-05-18 MED ORDER — LORAZEPAM 2 MG/ML IJ SOLN
1.0000 mg | Freq: Once | INTRAMUSCULAR | Status: AC
  Administered 2017-05-19: 1 mg via INTRAVENOUS
  Filled 2017-05-18: qty 1

## 2017-05-18 MED ORDER — GI COCKTAIL ~~LOC~~
30.0000 mL | Freq: Once | ORAL | Status: AC
  Administered 2017-05-18: 30 mL via ORAL
  Filled 2017-05-18: qty 30

## 2017-05-18 NOTE — ED Provider Notes (Signed)
[facility] EMERGENCY DEPARTMENT Provider Note   CSN: [id number] Arrival date & time: 05/18/17  1455     History   Chief Complaint Chief Complaint  Patient presents with  . Chest Pain    HPI Jesus Taylor is a 27 y.o. male with past medical history significant for GERD and esophagitis presenting with substernal squeezing chest pain nonradiating with previously associated shortness of breath.  Patient also reports vomiting 3 times earlier today but denies any nausea at this time.  He states that he has been to the emergency department for the same symptoms over 3 times now and has not followed up with gastroenterology.  He had a barium swallow and was told he did not need an endoscopy at the time.  Reports a family history of severe reflux and esophageal spasm.  Patient states that the vomiting and pain are new symptoms but the rest is the same as before. Previously experienced more of a tightness now it feels like pain.  He denies any diaphoresis.  His symptoms were aggravated by eating foods that are triggers over the weekend.  He is currently taking Nexium and sucralfate but reports no relief today. On my initial assessment he is reporting that his symptoms have overall improved and denies any nausea.  He denies any fever, cough, hemoptysis, history of DVT/PE, recent surgery, prolonged immobilization, unilateral lower extremity swelling or calf pain.  HPI  History reviewed. No pertinent past medical history.  Patient Active Problem List   Diagnosis Date Noted  . Metatarsal stress fracture of left foot 06/19/2014    History reviewed. No pertinent surgical history.      Home Medications    Prior to Admission medications   Medication Sig Start Date End Date Taking? Authorizing Provider  esomeprazole (NEXIUM) 20 MG capsule Take 20 mg by mouth daily at 12 noon.   Yes [provider]  sucralfate (CARAFATE) 1 g tablet Take 1 tablet (1 g total) by  mouth 4 (four) times daily -  with meals and at bedtime for 10 days. 03/01/17 05/18/25 Yes Shaune Pollack, MD    Family History No family history on file.  Social History Social History   Tobacco Use  . Smoking status: Never Smoker  . Smokeless tobacco: Never Used  Substance Use Topics  . Alcohol use: Yes    Comment: weekly  . Drug use: No     Allergies   Patient has no known allergies.   Review of Systems Review of Systems  Constitutional: Negative for chills, diaphoresis, fatigue and fever.  HENT: Negative for sore throat, trouble swallowing and voice change.   Respiratory: Positive for shortness of breath. Negative for cough, choking, chest tightness, wheezing and stridor.        Reporting shortness of breath with the pain earlier.  Not at this time.  Cardiovascular: Negative for chest pain, palpitations and leg swelling.  Gastrointestinal: Positive for vomiting. Negative for abdominal distention, abdominal pain, diarrhea and nausea.  Genitourinary: Negative for difficulty urinating, dysuria and hematuria.  Musculoskeletal: Negative for arthralgias, back pain, myalgias, neck pain and neck stiffness.  Skin: Negative for color change, pallor and rash.  Neurological: Negative for dizziness, seizures, syncope, light-headedness and headaches.     Physical Exam Updated Vital Signs BP 122/89   Pulse 96   Temp 98.1 F (36.7 C)   Resp 14   Wt 72.6 kg (160 lb)   SpO2 100%   BMI 23.63 kg/m   Physical Exam  Constitutional: He appears well-developed and well-nourished.  Non-toxic appearance. He does not appear ill. No distress.  Afebrile, nontoxic-appearing, lying comfortably in bed no acute distress.  HENT:  Head: Normocephalic and atraumatic.  Eyes: Conjunctivae and EOM are normal.  Neck: Normal range of motion. Neck supple.  Cardiovascular: Normal rate, regular rhythm, intact distal pulses and normal pulses.  No murmur heard. Pulmonary/Chest: Effort normal and breath  sounds normal. No accessory muscle usage or stridor. No tachypnea. No respiratory distress. He has no decreased breath sounds. He has no wheezes. He has no rhonchi. He has no rales.  Abdominal: Soft. He exhibits no distension and no mass. There is no tenderness. There is no rebound and no guarding.  Musculoskeletal: He exhibits no edema.       Right lower leg: Normal. He exhibits no tenderness and no edema.       Left lower leg: Normal. He exhibits no tenderness and no edema.  Neurological: He is alert.  Skin: Skin is warm and dry. No rash noted. He is not diaphoretic. No erythema. No pallor.  Psychiatric: He has a normal mood and affect.  Nursing note and vitals reviewed.    ED Treatments / Results  Labs (all labs ordered are listed, but only abnormal results are displayed) Labs Reviewed  BASIC METABOLIC PANEL - Abnormal; Notable for the following components:      Result Value   CO2 21 (*)    All other components within normal limits  CBC - Abnormal; Notable for the following components:   WBC 14.9 (*)    Platelets 445 (*)    All other components within normal limits  HEPATIC FUNCTION PANEL - Abnormal; Notable for the following components:   Total Protein 8.5 (*)    Total Bilirubin 1.5 (*)    Indirect Bilirubin 1.3 (*)    All other components within normal limits  LIPASE, BLOOD - Abnormal; Notable for the following components:   Lipase 66 (*)    All other components within normal limits  I-STAT TROPONIN, ED  I-STAT TROPONIN, ED    EKG EKG Interpretation  Date/Time:  Monday May 18 2017 16:05:24 EDT Ventricular Rate:  87 PR Interval:  168 QRS Duration: 78 QT Interval:  370 QTC Calculation: 445 R Axis:   63 Text Interpretation:  Normal sinus rhythm Normal ECG Artifact When compared with ECG of  04/22/2017, No significant change was found Confirmed by Dione Booze ([id number]) on 05/18/2017 11:51:18 PM   Radiology Dg Chest 2 View  Result Date: 05/18/2017 CLINICAL DATA:   Mid chest pain and shortness of breath for 9 hours. EXAM: CHEST - 2 VIEW COMPARISON:  04/23/2017 FINDINGS: Stable lobularity of the left hemidiaphragm containing stomach and colon. The lungs appear clear.  Cardiac and mediastinal contours normal. No pleural effusion identified. IMPRESSION: 1.  No active cardiopulmonary disease is radiographically apparent. 2. Stable lobularity of the left hemidiaphragm. Electronically Signed   By: Gaylyn Rong M.D.   On: 05/18/2017 18:36    Procedures Procedures (including critical care time)  Medications Ordered in ED Medications  famotidine (PEPCID) IVPB 20 mg premix (0 mg Intravenous Stopped 05/18/17 2330)  gi cocktail (Maalox,Lidocaine,Donnatal) (30 mLs Oral Given 05/18/17 2253)  sodium chloride 0.9 % bolus 500 mL (500 mLs Intravenous Given 05/18/17 2256)  LORazepam (ATIVAN) injection 1 mg (1 mg Intravenous Given 05/19/17 0018)     Initial Impression / Assessment and Plan / ED Course  I have reviewed the triage vital signs and the nursing  notes.  Pertinent labs & imaging results that were available during my care of the patient were reviewed by me and considered in my medical decision making (see chart for details).     Patient presenting with recurrent symptoms of substernal chest tightness pain with associated vomiting and shortness of breath.  PERC negative  EKG normal sinus rhythm, troponin negative, chest x-ray negative Heart score: 1  Patient was given GI cocktail and Pepcid and will reassess On reassessment, Patient reported significant improvement and no pain. He still reports some tightness. Patient was given ativan with complete resolution of symptoms as with prior ED visits. Discussed with patient anxiety as contributing factor to his symptoms and urged to discuss with his PCP.  Lipase slightly elevated. Discussed the importance of discontinuing alcohol binging with patient extensively.  Will discharge with symptomatic relief  regimen and GI follow up as well as close PCP follow up.  Discussed strict return precautions and advised to return to the emergency department if experiencing any new or worsening symptoms. Instructions were understood and patient agreed with discharge plan.  Final Clinical Impressions(s) / ED Diagnoses   Final diagnoses:  Nonspecific chest pain  Heartburn  Anxiety    ED Discharge Orders    None       Caylie Sandquist, JessicaGregary Cromer B, PA-C 05/19/17 [id number]    Arby BarrettePfeiffer, Marcy, MD 05/31/17 1525

## 2017-05-18 NOTE — ED Notes (Signed)
NaCL bolus complete.

## 2017-05-18 NOTE — ED Triage Notes (Signed)
Pt presents to the ed with complaints of chest pain, shortness of breath and pain radiating to his left arm. nad in triage.

## 2017-05-18 NOTE — Discharge Instructions (Signed)
As discussed, make sure that you take your PPI (Nexium) every day, 30 minutes before breakfast. Avoid trigger foods, eating late and/or just before bedtime, smoking, alcohol etc.  Follow up with Gastroenterology. You may need an endoscopy to further evaluate persistent symptoms. Follow up with your Primary care provider.  Return if symptoms worsen or new concerning symptoms in the meantime.

## 2018-07-02 ENCOUNTER — Ambulatory Visit: Payer: Self-pay | Admitting: Internal Medicine

## 2018-07-02 NOTE — Telephone Encounter (Signed)
Pt. Reports since yesterday he has had ringing in his right ear, mild dizziness, sleepy, throbbing sensation in his head, "but no headache." Does not remember any injury to his head, but "maybe I shook it hard moving some cabinets." Instructed to be seen in either UC or ED. Verbalizes understanding.  Reason for Disposition . [1] Hearing loss in one or both ears AND [2] sudden onset AND [3] present now  Answer Assessment - Initial Assessment Questions 1. DESCRIPTION: "Describe the sound you are hearing." (e.g., hissing, humming, pounding, ringing)     Ringing 2. LOCATION: "One or both ears?" If one, ask: "Which ear?"     Right ear 3. SEVERITY: "How bad is it?"    - MILD - doesn't interfere with normal activities, only can hear in a quiet room    - MODERATE - SEVERE (Bothersome): interferes with work, school, sleep, or other activities      Mild 4. ONSET: "When did this begin?" "Did it start suddenly or come on gradually?"     Yesterday 5. PATTERN: "Does this come and go, or has it been constant since it started?"     Constant 6. HEARING LOSS: "Is your hearing decreased?" (e.g., normal, decreased)       No hearing loss 7. OTHER SYMPTOMS: "Do you have any other symptoms?" (e.g., dizziness, earache)     Some dizziness, sleepiness 8. PREGNANCY: "Is there any chance you are pregnant?" "When was your last menstrual period?"     n/a  Protocols used: [facility]

## 2018-11-26 DIAGNOSIS — R109 Unspecified abdominal pain: Secondary | ICD-10-CM | POA: Diagnosis not present

## 2018-11-26 DIAGNOSIS — Z23 Encounter for immunization: Secondary | ICD-10-CM | POA: Diagnosis not present

## 2018-11-26 DIAGNOSIS — R748 Abnormal levels of other serum enzymes: Secondary | ICD-10-CM | POA: Diagnosis not present

## 2018-11-26 DIAGNOSIS — R030 Elevated blood-pressure reading, without diagnosis of hypertension: Secondary | ICD-10-CM | POA: Diagnosis not present

## 2018-12-01 DIAGNOSIS — R748 Abnormal levels of other serum enzymes: Secondary | ICD-10-CM | POA: Diagnosis not present

## 2018-12-02 ENCOUNTER — Other Ambulatory Visit: Payer: Self-pay | Admitting: Family Medicine

## 2018-12-02 ENCOUNTER — Other Ambulatory Visit: Payer: Self-pay | Admitting: Internal Medicine

## 2018-12-02 DIAGNOSIS — R7989 Other specified abnormal findings of blood chemistry: Secondary | ICD-10-CM

## 2018-12-08 ENCOUNTER — Ambulatory Visit
Admission: RE | Admit: 2018-12-08 | Discharge: 2018-12-08 | Disposition: A | Discharge status: Home / Self Care | Payer: BLUE CROSS/BLUE SHIELD | Source: Ambulatory Visit | Attending: Internal Medicine | Admitting: Internal Medicine

## 2018-12-08 DIAGNOSIS — R7989 Other specified abnormal findings of blood chemistry: Secondary | ICD-10-CM

## 2018-12-08 DIAGNOSIS — R748 Abnormal levels of other serum enzymes: Secondary | ICD-10-CM | POA: Diagnosis not present

## 2018-12-13 DIAGNOSIS — F1020 Alcohol dependence, uncomplicated: Secondary | ICD-10-CM | POA: Diagnosis not present

## 2018-12-13 DIAGNOSIS — K760 Fatty (change of) liver, not elsewhere classified: Secondary | ICD-10-CM | POA: Diagnosis not present

## 2018-12-13 DIAGNOSIS — R7989 Other specified abnormal findings of blood chemistry: Secondary | ICD-10-CM | POA: Diagnosis not present

## 2019-06-09 ENCOUNTER — Ambulatory Visit (HOSPITAL_COMMUNITY): Payer: Self-pay | Admitting: Licensed Clinical Social Worker

## 2019-06-09 ENCOUNTER — Ambulatory Visit (HOSPITAL_COMMUNITY)
Admission: RE | Admit: 2019-06-09 | Discharge: 2019-06-09 | Disposition: A | Discharge status: Home / Self Care | Payer: BLUE CROSS/BLUE SHIELD | Attending: Psychiatry | Admitting: Psychiatry

## 2019-06-09 DIAGNOSIS — F1020 Alcohol dependence, uncomplicated: Secondary | ICD-10-CM | POA: Insufficient documentation

## 2019-06-09 DIAGNOSIS — Z1389 Encounter for screening for other disorder: Secondary | ICD-10-CM | POA: Diagnosis present
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
## 2019-06-09 NOTE — H&P (Signed)
Behavioral Health Medical Screening Exam  Jesus Taylor is a 29 y.o. male AA male with prior Hx of alcoholism. He walked-in to the [facility] seeking assistance & referral to a substance abuse treatment program to enroll in. He says he has 3 separate DWI charges against him that rendered him by the court order to do a 30 day substance abuse treatment program. He reports, "I got DWI charges. I'm ordered by the court to do a 30 days substance abuse treatment program that includes ways to control my drinking problems & ways to cope with my cravings/triggers. I have to do both inpatient & outpatient substance abuse treatment programs. I had done/completed an inpatient treatment program in a facility in [place], [place] on March 16th, 2021. Now I need to do the outpatient substance abuse treatment program. That is the reason I'm here. In 2020, I was at the [facility] in [place] for alcohol detoxification treatments. I don't have any acute mental health issues going on. I was diagnosed with depression in November, was given medication for just 1 month. I don't think the I need depression medicine any more". This patient currently & adamantly denies any symptoms of depression or anxiety. Denies any SIHI, AVH, delusional thoughts or paranoia. He does not appear to be responding to any internal stimuli. Patient will be provided with outpatient substance abuse treatment resources upon discharge as there are no clinical criteria to keep him admitted to the hospital at this time. He was able to engage in safety planning including plan to return to [facility] or contact emergency services if he feels unable to maintain his own safety or the safety of others.   Total Time spent with patient: 25 minutes  Psychiatric Specialty Exam: Physical Exam  Nursing note and vitals reviewed. Constitutional: He is oriented to person, place, and time. He appears well-developed.  Eyes: Pupils are equal, round, and  reactive to light.  Respiratory: No respiratory distress. He has no wheezes. He has no rales. He exhibits no tenderness.  GI:  Hx. Gerd  Genitourinary:    Genitourinary Comments: Deferred   Musculoskeletal:        General: Normal range of motion.     Cervical back: Normal range of motion.  Neurological: He is alert and oriented to person, place, and time.  Skin: Skin is warm and dry.    Review of Systems  Constitutional: Negative for chills, diaphoresis and fever.  HENT: Negative for congestion, rhinorrhea, sneezing and sore throat.   Eyes: Negative for discharge.  Respiratory: Negative for cough, chest tightness, shortness of breath and wheezing.   Gastrointestinal: Negative for diarrhea, nausea and vomiting.  Endocrine: Negative for cold intolerance.  Genitourinary: Negative for difficulty urinating.  Musculoskeletal: Negative for arthralgias and myalgias.  Skin: Negative for color change, rash and wound.  Allergic/Immunologic: Negative for environmental allergies and food allergies.       NKDA  Neurological: Negative for dizziness, tremors, seizures, syncope, light-headedness, numbness and headaches.  Psychiatric/Behavioral: Negative for agitation, behavioral problems, confusion, decreased concentration, dysphoric mood, hallucinations, self-injury, sleep disturbance and suicidal ideas. The patient is not nervous/anxious and is not hyperactive.     Blood pressure (!) 139/102, pulse 76, temperature 98.3 F (36.8 C), temperature source Oral, resp. rate 20, SpO2 100 %.There is no height or weight on file to calculate BMI.  General Appearance: Casual and Fairly Groomed  Eye Contact:  Good  Speech:  Clear and Coherent and Normal Rate  Volume:  Normal  Mood:  Euthymic, denies any symptoms of depression or anxiety.  Affect:  Appropriate  Thought Process:  Coherent, Linear and Descriptions of Associations: Intact  Orientation:  Full (Time, Place, and Person)  Thought Content:  Logical,  denies any hallucinations, delusions or paranoia.  Suicidal Thoughts:  Denies any thoughts, plans or intent.  Homicidal Thoughts:  Denies  Memory:  Immediate;   Good Recent;   Good Remote;   Good  Judgement:  Fair  Insight:  Fair  Psychomotor Activity:  Normal  Concentration: Concentration: Good and Attention Span: Good  Recall:  Good  Fund of Knowledge:Fair  Language: Good  Akathisia:  NA  Handed:  Right  AIMS (if indicated):     Assets:  Communication Skills Desire for Improvement Physical Health  Sleep:      Musculoskeletal: Strength & Muscle Tone: within normal limits Gait & Station: normal Patient leans: N/A  Blood pressure (!) 139/102, pulse 76, temperature 98.3 F (36.8 C), temperature source Oral, resp. rate 20, SpO2 100 %.  Recommendations: Referred to [facility] to schedule an appointment & start substance abuse counseling sessions.  Based on my evaluation the patient does not appear to have an emergency medical condition.  Armandina Stammer, NP, PMHNP, FNP-BC 06/09/2019, 11:15 AM

## 2019-06-10 ENCOUNTER — Other Ambulatory Visit: Payer: Self-pay

## 2019-06-10 ENCOUNTER — Ambulatory Visit (INDEPENDENT_AMBULATORY_CARE_PROVIDER_SITE_OTHER): Payer: BLUE CROSS/BLUE SHIELD | Admitting: Licensed Clinical Social Worker

## 2019-06-10 DIAGNOSIS — F1994 Other psychoactive substance use, unspecified with psychoactive substance-induced mood disorder: Secondary | ICD-10-CM

## 2019-06-10 DIAGNOSIS — F1020 Alcohol dependence, uncomplicated: Secondary | ICD-10-CM | POA: Diagnosis not present
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
## 2019-06-15 ENCOUNTER — Other Ambulatory Visit (HOSPITAL_COMMUNITY): Payer: BLUE CROSS/BLUE SHIELD | Attending: Psychiatry | Admitting: Licensed Clinical Social Worker

## 2019-06-15 ENCOUNTER — Other Ambulatory Visit: Payer: Self-pay

## 2019-06-15 DIAGNOSIS — F1010 Alcohol abuse, uncomplicated: Secondary | ICD-10-CM

## 2019-06-16 ENCOUNTER — Other Ambulatory Visit: Payer: Self-pay

## 2019-06-16 ENCOUNTER — Other Ambulatory Visit (HOSPITAL_COMMUNITY): Payer: BLUE CROSS/BLUE SHIELD | Admitting: Licensed Clinical Social Worker

## 2019-06-16 DIAGNOSIS — F1010 Alcohol abuse, uncomplicated: Secondary | ICD-10-CM | POA: Diagnosis not present

## 2019-06-16 DIAGNOSIS — F1994 Other psychoactive substance use, unspecified with psychoactive substance-induced mood disorder: Secondary | ICD-10-CM

## 2019-06-16 NOTE — Progress Notes (Signed)
    Daily Group Progress Note  Program: CD-IOP   Group Time: 1-2:30pm Participation Level: minimal  Behavioral Response: Appropriate Type of Therapy: Process Group   Topic: Clinician checked in with group members, assessing for SI/HI/psychosis and overall level of functioning, including cravings, relapse, and participation in community support meetings. Clinician to identify and process 'highs' and 'lows' since last group. Clinician and group members read Daily Reflection and Just For Today with a topic focused on Fear, and discussed any threats to client sobriety. Clinician provided group members with Progressive Muscle Relaxation activity to practice minfulness   Group Time: 2:30-4pm Participation Level: minimal Behavioral Response: Appropriate Type of Therapy: Psycho-educational Group  Clinician presented 'Four Most Common Reasons for Relapse in Early Recovery' and discussed progression from trigger to thought to craving to use. Clinician and group members reviewed Addict Mind vs Clean Mind with a focus on identifying how to stay in Clear Mind. Clinician and group members reviewed 'Five Common Problems in Early Recovery: New Solutions" Handout 13 from Early Recovery Skills Group book. Clinician and group members identified possible problems and alternative behaviors to uncomfortable feelings, friends and easy access to alcohol. Group members checked out with self care plan for evening.     Summary: Client presented for fist CDIOP session reported sobriety date of 06/12/19. Client shared being in program to complete DWI requirements. Client was minimally engaged throughout session and did not engage without prompting. Client encouraged to look up virtual AA meetings and begin attendance.   Family Program: Family present? No   Name of family member(s): NA  UDS collected: Yes Results: pending  AA/NA attended?: No  Sponsor?: No   Olamide Carattini A Taeshawn Helfman, LCSW, LCAS

## 2019-06-20 ENCOUNTER — Other Ambulatory Visit (INDEPENDENT_AMBULATORY_CARE_PROVIDER_SITE_OTHER): Payer: BLUE CROSS/BLUE SHIELD | Admitting: Licensed Clinical Social Worker

## 2019-06-20 ENCOUNTER — Encounter (HOSPITAL_COMMUNITY): Payer: Self-pay | Admitting: Medical

## 2019-06-20 ENCOUNTER — Other Ambulatory Visit: Payer: Self-pay

## 2019-06-20 VITALS — BP 108/76 | HR 100 | Temp 98.3°F | Resp 18 | Ht 69.0 in | Wt 165.8 lb

## 2019-06-20 DIAGNOSIS — F4312 Post-traumatic stress disorder, chronic: Secondary | ICD-10-CM

## 2019-06-20 DIAGNOSIS — F10280 Alcohol dependence with alcohol-induced anxiety disorder: Secondary | ICD-10-CM

## 2019-06-20 DIAGNOSIS — Z811 Family history of alcohol abuse and dependence: Secondary | ICD-10-CM | POA: Diagnosis not present

## 2019-06-20 DIAGNOSIS — Z8719 Personal history of other diseases of the digestive system: Secondary | ICD-10-CM

## 2019-06-20 DIAGNOSIS — K2920 Alcoholic gastritis without bleeding: Secondary | ICD-10-CM

## 2019-06-20 DIAGNOSIS — F1094 Alcohol use, unspecified with alcohol-induced mood disorder: Secondary | ICD-10-CM | POA: Diagnosis not present

## 2019-06-20 DIAGNOSIS — T7412XS Child physical abuse, confirmed, sequela: Secondary | ICD-10-CM

## 2019-06-20 DIAGNOSIS — F10980 Alcohol use, unspecified with alcohol-induced anxiety disorder: Secondary | ICD-10-CM

## 2019-06-20 DIAGNOSIS — F1210 Cannabis abuse, uncomplicated: Secondary | ICD-10-CM | POA: Diagnosis not present

## 2019-06-20 DIAGNOSIS — F10230 Alcohol dependence with withdrawal, uncomplicated: Secondary | ICD-10-CM

## 2019-06-20 DIAGNOSIS — F1010 Alcohol abuse, uncomplicated: Secondary | ICD-10-CM | POA: Diagnosis not present

## 2019-06-20 MED ORDER — BACLOFEN 10 MG PO TABS: 10.0000 mg | ORAL_TABLET | Freq: Three times a day (TID) | ORAL | 1 refills | Status: DC

## 2019-06-20 MED ORDER — SUCRALFATE 1 G PO TABS: 1.0000 g | ORAL_TABLET | Freq: Four times a day (QID) | ORAL | 1 refills | Status: DC

## 2019-06-20 MED ORDER — NALTREXONE HCL 50 MG PO TABS: 50.0000 mg | ORAL_TABLET | Freq: Every day | ORAL | 0 refills | Status: DC
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
## 2019-06-22 ENCOUNTER — Other Ambulatory Visit: Payer: Self-pay

## 2019-06-22 ENCOUNTER — Other Ambulatory Visit (HOSPITAL_COMMUNITY): Payer: BLUE CROSS/BLUE SHIELD | Admitting: Licensed Clinical Social Worker

## 2019-06-22 DIAGNOSIS — Z811 Family history of alcohol abuse and dependence: Secondary | ICD-10-CM

## 2019-06-22 DIAGNOSIS — F10230 Alcohol dependence with withdrawal, uncomplicated: Secondary | ICD-10-CM

## 2019-06-22 DIAGNOSIS — F4312 Post-traumatic stress disorder, chronic: Secondary | ICD-10-CM

## 2019-06-22 DIAGNOSIS — F1010 Alcohol abuse, uncomplicated: Secondary | ICD-10-CM | POA: Diagnosis not present

## 2019-06-22 DIAGNOSIS — F1094 Alcohol use, unspecified with alcohol-induced mood disorder: Secondary | ICD-10-CM

## 2019-06-22 NOTE — Progress Notes (Signed)
    Daily Group Progress Note  Program: CD-IOP   Group Time: 1-2:30pm Participation Level: Active  Behavioral Response: Appropriate Type of Therapy: Process Group   Topic: Clinician checked in with group members, assessing for SI/HI/psychosis and overall level of functioning, including cravings, relapse, and participation in community support meetings. Clinician to identify and process 'highs' and 'lows' since last group. Clinician and group members read Daily Reflection and Just For Today and discussed any threats to client sobriety.  Group Time: 2:30pm-4pm Participation Level:  Active Behavioral Response: Appropriate  Type of Therapy: Psycho-education Group  Topic: Clinician presented the psycho-educational topic of neuroplasticity. Clinician provided supportive video 'Rewiring the Anxious Mind' and discussed connection between thoughts, feelings, and behaviors. Clinician and group members practiced identifying specific thoughts, feelings, and body sensations at different intensities of emotions. Clinician and group members practiced creating alternative thoughts for more comfortable, manageable feelings. Clinician presented DBT skill 'Letting Go of Emotional Suffering: Mindfulness of Your Current Emotion.' Group practiced with clinician pretzel and butterfly tapping as skills to regulate emotional intensity. Clinician inquired about self care plan for the weekend.     Summary: Client reported sobriety date maintained. Client appeared to be actively listening to psycho-educational information however engaged in conversations only when prompted by clinician. Client denies threats to sobriety and plans to spend less time out partying on the weekends to avoid higher risk situations.   Family Program: Family present? No   Name of family member(s): NA  UDS collected: No Results: NA  AA/NA attended?: No, agrees to attend virtual meeting over weekend.  Sponsor?: No   Shade Flood Mckynzi Cammon,  LCSW, LCAS

## 2019-06-23 ENCOUNTER — Encounter (HOSPITAL_COMMUNITY): Payer: BLUE CROSS/BLUE SHIELD

## 2019-06-23 NOTE — Progress Notes (Signed)
    Daily Group Progress Note  Program: CDIOP   Group Time: 1pm-2:30pm Participation Level: Active Behavioral Response: Appropriate Type of Therapy: Process Group Topic: Clinician checked in with group members, assessing for SI/HI/psychosis and overall level of functioning including cravings and substance use. Clinician inquired about sobriety date and meetings attended since last session. Clinician and group members processed successes and struggles over the weekend and skills used to address. Clinician praised individual use of insight to specific triggers for substance use including feelings, behaviors, and previous habits. Clinician and group members processed struggle with lack of validation from support system and the effects of lack of control over situations in which we want to be helpful. Clinician presented [facility] meditation as mindfulness activity.  Group Time: 2:30pm-4pm Participation Level: Active Behavioral Response: Appropriate Type of Therapy: Psycho-education Group Topic: Clinician presented the psycho-educational topic of addressing Anger. Clinician and group members discussed anger as a secondary emotion and how/where expression of anger was learned and accepted growing up. Clinician and group members identified physical symptoms of 'small' anger through 'overwhelming' anger. Clinician encouraged group members to be mindful of body sensations to address uncomfortable feelings individually at a less intense level. Clinician and group members started recognizing anger triggers in different settings, and how anger is felt in the body at different intensities. Clinician utilized [facility] Understanding Anger: Tips for Managing Anger worksheet. Clinician presented [employer], focused on healthy communication during disagreements. Group celebrated one member successful graduation.   Summary:  Client maintains sobriety date of 06/20/19. Client identified no reason for not attending  meetings stating anything he said would be an excuse. Client stated he did attempt to reach out to friend's boxing gym but has not followed up after first missed attempt. Client planned to continue working and studying for [employer] exam. Client was receptive to conversation about planning day around meetings as a priority rather than 'fitting in' meetings into current schedule. Client identified likely reasons behind anger and how anger is presented. Client notes having to walk away to avoid becoming aggressive when upset.   Family Program: Family present? No   Name of family member(s): NA  UDS collected: No Results: pending  AA/NA attended?: No  Sponsor?: No   Loys Shugars A Jae Skeet, LCSW, LCAS

## 2019-06-27 ENCOUNTER — Telehealth (HOSPITAL_COMMUNITY): Payer: Self-pay | Admitting: Licensed Clinical Social Worker

## 2019-06-27 ENCOUNTER — Encounter (HOSPITAL_COMMUNITY): Payer: Self-pay | Admitting: Medical

## 2019-06-27 ENCOUNTER — Other Ambulatory Visit: Payer: Self-pay

## 2019-06-27 ENCOUNTER — Other Ambulatory Visit (HOSPITAL_COMMUNITY): Payer: BLUE CROSS/BLUE SHIELD

## 2019-06-27 NOTE — Progress Notes (Signed)
Patient ID: Jesus Taylor, male   DOB: 1990/10/21, 29 y.o.   MRN: [id number] Pt scheduled for 1 week Provider FU S/P admission. He has failed to show uo for last 2 groups and to call.May have to be discharged if we are unable to speak with him

## 2019-06-28 NOTE — Progress Notes (Signed)
    Daily Group Progress Note  Program: CD-IOP   Group Time: 1pm-2:30pm, 2:30pm-4pm Participation Level: Active Behavioral Response: Appropriate Type of Therapy: Process Group, Psycho-educational Group   Topic: Clinician checked in with group members, assessing for SI/HI/psychosis and overall level of functioning including cravings and substance use. Clinician inquired about sobriety date and meetings attended since last session. Clinician and group members processed successes and struggles over the weekend and skills used to address. Clinician praised individual use of insight to specific triggers for substance use including feelings, behaviors, and previous habits. Clinician and group members discussed the importance of identifying triggers and 'setting up for success' based on decreasing known opportunities for high risk situations. Clinician and group members read and processed Daily Reflection and Just For Today focusing on self-acceptance. Clinician presented the topic of radical acceptance and making decisions based on current facts and realistic view of self, not ideal situations. Group members discussed factors involved with their sense of self and worth including success or failures in finances or recovery.     Summary:  Danice Goltz reported sobriety date of 06/20/19. Client reports attending 0 AA meetings. Client identified relapse due to boredom and the need for increased use of hobbies to fill time. Client identified the possibility of reaching out to a friend's boxing gym which would help feel time and release energy related to uncomfortable feelings built up.   Family Program: Family present? No   Name of family member(s): NA  UDS collected: Yes Results: reports will be positive for alcohol  AA/NA attended?: No  Sponsor?: No   Hermie Reagor A Miria Cappelli, LCSW, LCAS

## 2019-06-29 ENCOUNTER — Encounter (HOSPITAL_COMMUNITY): Payer: Self-pay | Admitting: Medical

## 2019-06-29 ENCOUNTER — Ambulatory Visit (INDEPENDENT_AMBULATORY_CARE_PROVIDER_SITE_OTHER): Payer: BLUE CROSS/BLUE SHIELD | Admitting: Medical

## 2019-06-29 DIAGNOSIS — F4312 Post-traumatic stress disorder, chronic: Secondary | ICD-10-CM

## 2019-06-29 DIAGNOSIS — K2920 Alcoholic gastritis without bleeding: Secondary | ICD-10-CM

## 2019-06-29 DIAGNOSIS — F10280 Alcohol dependence with alcohol-induced anxiety disorder: Secondary | ICD-10-CM

## 2019-06-29 DIAGNOSIS — Z811 Family history of alcohol abuse and dependence: Secondary | ICD-10-CM

## 2019-06-29 DIAGNOSIS — Z5329 Procedure and treatment not carried out because of patient's decision for other reasons: Secondary | ICD-10-CM

## 2019-06-29 DIAGNOSIS — Z9119 Patient's noncompliance with other medical treatment and regimen: Secondary | ICD-10-CM

## 2019-06-29 DIAGNOSIS — F1094 Alcohol use, unspecified with alcohol-induced mood disorder: Secondary | ICD-10-CM

## 2019-06-29 DIAGNOSIS — F10230 Alcohol dependence with withdrawal, uncomplicated: Secondary | ICD-10-CM

## 2019-06-29 DIAGNOSIS — Z8719 Personal history of other diseases of the digestive system: Secondary | ICD-10-CM

## 2019-06-29 NOTE — Progress Notes (Signed)
Patient ID: KHRIZ LIDDY, male   DOB: 1990-04-08, 29 y.o.   MRN: [id number] Pt scheduled for CD IOPovider FU-he has not attended treatment group past 3 sesssions. Counselor has spoken to mother who originally came with patient concerned about his drinking. 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 Per Counselor. mother not concerned based on phone conversation.

## 2019-06-30 ENCOUNTER — Encounter (HOSPITAL_COMMUNITY): Payer: BLUE CROSS/BLUE SHIELD

## 2019-07-02 ENCOUNTER — Encounter (HOSPITAL_COMMUNITY): Payer: Self-pay | Admitting: Licensed Clinical Social Worker

## 2019-07-04 ENCOUNTER — Other Ambulatory Visit: Payer: Self-pay

## 2019-07-04 ENCOUNTER — Other Ambulatory Visit (HOSPITAL_COMMUNITY): Payer: BLUE CROSS/BLUE SHIELD | Attending: Psychiatry | Admitting: Licensed Clinical Social Worker

## 2019-07-04 DIAGNOSIS — F1994 Other psychoactive substance use, unspecified with psychoactive substance-induced mood disorder: Secondary | ICD-10-CM | POA: Diagnosis not present

## 2019-07-04 DIAGNOSIS — K219 Gastro-esophageal reflux disease without esophagitis: Secondary | ICD-10-CM | POA: Diagnosis not present

## 2019-07-04 DIAGNOSIS — Z6372 Alcoholism and drug addiction in family: Secondary | ICD-10-CM | POA: Insufficient documentation

## 2019-07-04 DIAGNOSIS — F10980 Alcohol use, unspecified with alcohol-induced anxiety disorder: Secondary | ICD-10-CM | POA: Insufficient documentation

## 2019-07-04 DIAGNOSIS — Z811 Family history of alcohol abuse and dependence: Secondary | ICD-10-CM

## 2019-07-04 DIAGNOSIS — F10230 Alcohol dependence with withdrawal, uncomplicated: Secondary | ICD-10-CM

## 2019-07-04 DIAGNOSIS — F1210 Cannabis abuse, uncomplicated: Secondary | ICD-10-CM | POA: Insufficient documentation

## 2019-07-04 DIAGNOSIS — F1094 Alcohol use, unspecified with alcohol-induced mood disorder: Secondary | ICD-10-CM

## 2019-07-04 DIAGNOSIS — K2920 Alcoholic gastritis without bleeding: Secondary | ICD-10-CM | POA: Insufficient documentation

## 2019-07-04 DIAGNOSIS — F4312 Post-traumatic stress disorder, chronic: Secondary | ICD-10-CM | POA: Diagnosis present

## 2019-07-04 DIAGNOSIS — Z79899 Other long term (current) drug therapy: Secondary | ICD-10-CM | POA: Insufficient documentation

## 2019-07-04 NOTE — Progress Notes (Signed)
    Daily Group Progress Note  Program: CD-IOP  Group Time: 1-2:30pm   Participation Level: Passive   Behavioral Response: Appropriate   Type of Therapy: Process Group   Topic: Clinician checked in with group members, assessing for SI/HI/psychosis and overall level of functioning, including cravings, threats to sobriety relapse, and number of community support groups attended since last session. Clinician and group members processed 'highs' and 'lows' since last group and how challenges effected their thoughts or behaviors. Clinician allowed space for members to appropriately confront and provide support to a fellow member regarding cravings and relapse issues. Clinician provided validation and support throughout group session.     Group Time: 2:30pm-4pm   Participation Level: Active   Behavioral Response: Appropriate   Type of Therapy: Psycho-education Group   Topic: Clinician introduced topic of today's group session centered on the mask often worn regarding which traits or characteristics are presented to others vs who we are internally. Clinician prompted discussion on risks and benefits in being vulnerable and authentic with others and how this is impacted by active addiction and sobriety. Clinician provided blank template while encouraging members to create their own mask and share responses with fellow members. Clinician facilitated check-out by assessing for 1 self-care activity that members will engage in prior to next group session.   Summary: Client reported maintained sobriety date of 06/20/19 and denies attending any community support meetings since last group session. Client reported that he wasn't present last week due to "being busy with work, I'm focused on money right now" and received feedback on prioritizing treatment and sobriety. Client denied any stressors or recent events that served as threats to his sobriety. Client relayed "I've never worn a mask before and  that's me being honest". Client engaged in discussion with fellow members and this clinician in identifying areas in which he has in fact worn a mask and processed how this impacted cravings to drink in the past. Client was receptive to feedback and responded appropriately when prompted by others.   Family Program: Family present? No   Name of family member(s): n/a  UDS collected: Yes Results: Results pending  AA/NA attended?: No, clt denies   [employer], LCSW

## 2019-07-06 ENCOUNTER — Other Ambulatory Visit: Payer: Self-pay

## 2019-07-06 ENCOUNTER — Other Ambulatory Visit (HOSPITAL_COMMUNITY): Payer: BLUE CROSS/BLUE SHIELD | Admitting: Licensed Clinical Social Worker

## 2019-07-06 DIAGNOSIS — F4312 Post-traumatic stress disorder, chronic: Secondary | ICD-10-CM | POA: Diagnosis not present

## 2019-07-06 DIAGNOSIS — F10230 Alcohol dependence with withdrawal, uncomplicated: Secondary | ICD-10-CM

## 2019-07-06 DIAGNOSIS — F1094 Alcohol use, unspecified with alcohol-induced mood disorder: Secondary | ICD-10-CM

## 2019-07-07 ENCOUNTER — Other Ambulatory Visit: Payer: Self-pay

## 2019-07-07 ENCOUNTER — Other Ambulatory Visit (HOSPITAL_COMMUNITY): Payer: BLUE CROSS/BLUE SHIELD | Admitting: Licensed Clinical Social Worker

## 2019-07-07 DIAGNOSIS — F10230 Alcohol dependence with withdrawal, uncomplicated: Secondary | ICD-10-CM

## 2019-07-07 DIAGNOSIS — F4312 Post-traumatic stress disorder, chronic: Secondary | ICD-10-CM | POA: Diagnosis not present

## 2019-07-07 NOTE — Progress Notes (Signed)
    Daily Group Progress Note  Program: CD-IOP   Group Time: 1-2:30pm Participation Level: Active Behavioral Response: Appropriate Type of Therapy: Process Group Topic: Clinician checked in with group members, assessing for SI/HI/psychosis and overall level of functioning, including cravings, threats to sobriety relapse, and number of community support groups attended since last session. Clinician and group members processed 'highs' and 'lows' since last group and how challenges effected their thoughts or behaviors. Clinician and group members normalized automatic thoughts related to use, and how to address thoughts without allowing them to turn ito cravings. Clinician and group members read JFT and AA Daily meditation and discussed meditation and spirituality in recovery. Clinician provided mindfulness activity focused on gratitude.  Group Time: 2:30pm-4pm Participation Level: Active Behavioral Response: Appropriate Type of Therapy: Process Group  Topic: Clinician provided psycho-educational information on Dysfunctional Family Roles and the Family and family 'Culture' related to substance abuse. Clinician and group members discussed reasons for roles including ability to rational, denial, avoidance of pain, and excuses to continue behaviors. Clinician reviewed roles and discussed with clients roles in current family as the chemically dependent person and family of origin. Clinician inquired about self care activity for the evenings.   Summary: Client presented ontime however did take a phone call at the beginning of group. Client shared progress toward finding enjoyment with activities sober. Client reported a focus on work to stay busy. Client shared thoughts on AA meeting recently and helpfulness of naltrexone with cravings. Client was pulled from group to review behavior contract including no additional missed groups, not taking phone calls during group sessions, and attended required number  of AA meetings each week. Client was in agreement with these and voiced understanding of consequences of violation of tx plan. Client reported sobriety date of 06/20/19.    Family Program: Family present? No   Name of family member(s): NA  UDS collected: Yes Results: pending. Completed UDS this day after not completed UDS monday  AA/NA attended?: Yes; 1 mtg  Sponsor?: No   Rakhi Romagnoli A Eloina Ergle, LCSW, LCAS

## 2019-07-11 ENCOUNTER — Encounter (HOSPITAL_COMMUNITY): Payer: Self-pay | Admitting: Medical

## 2019-07-11 ENCOUNTER — Other Ambulatory Visit (HOSPITAL_COMMUNITY): Payer: BLUE CROSS/BLUE SHIELD | Admitting: Licensed Clinical Social Worker

## 2019-07-11 ENCOUNTER — Other Ambulatory Visit: Payer: Self-pay

## 2019-07-11 DIAGNOSIS — F4312 Post-traumatic stress disorder, chronic: Secondary | ICD-10-CM

## 2019-07-11 DIAGNOSIS — F1994 Other psychoactive substance use, unspecified with psychoactive substance-induced mood disorder: Secondary | ICD-10-CM

## 2019-07-11 DIAGNOSIS — F10980 Alcohol use, unspecified with alcohol-induced anxiety disorder: Secondary | ICD-10-CM

## 2019-07-11 DIAGNOSIS — Z811 Family history of alcohol abuse and dependence: Secondary | ICD-10-CM

## 2019-07-11 DIAGNOSIS — Z8719 Personal history of other diseases of the digestive system: Secondary | ICD-10-CM

## 2019-07-11 DIAGNOSIS — F10930 Alcohol use, unspecified with withdrawal, uncomplicated: Secondary | ICD-10-CM

## 2019-07-11 DIAGNOSIS — K2920 Alcoholic gastritis without bleeding: Secondary | ICD-10-CM

## 2019-07-11 DIAGNOSIS — F10230 Alcohol dependence with withdrawal, uncomplicated: Secondary | ICD-10-CM

## 2019-07-11 DIAGNOSIS — T7412XS Child physical abuse, confirmed, sequela: Secondary | ICD-10-CM

## 2019-07-11 DIAGNOSIS — F1210 Cannabis abuse, uncomplicated: Secondary | ICD-10-CM

## 2019-07-11 DIAGNOSIS — F1094 Alcohol use, unspecified with alcohol-induced mood disorder: Secondary | ICD-10-CM

## 2019-07-11 MED ORDER — BACLOFEN 10 MG PO TABS: 10.0000 mg | ORAL_TABLET | Freq: Three times a day (TID) | ORAL | 1 refills | Status: DC

## 2019-07-11 MED ORDER — SUCRALFATE 1 G PO TABS: 1.0000 g | ORAL_TABLET | Freq: Four times a day (QID) | ORAL | 1 refills | Status: DC
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
## 2019-07-13 ENCOUNTER — Other Ambulatory Visit: Payer: Self-pay

## 2019-07-13 ENCOUNTER — Other Ambulatory Visit (HOSPITAL_COMMUNITY): Payer: BLUE CROSS/BLUE SHIELD | Admitting: Licensed Clinical Social Worker

## 2019-07-13 DIAGNOSIS — F10930 Alcohol use, unspecified with withdrawal, uncomplicated: Secondary | ICD-10-CM

## 2019-07-13 DIAGNOSIS — F1210 Cannabis abuse, uncomplicated: Secondary | ICD-10-CM

## 2019-07-13 DIAGNOSIS — F10230 Alcohol dependence with withdrawal, uncomplicated: Secondary | ICD-10-CM

## 2019-07-13 DIAGNOSIS — F4312 Post-traumatic stress disorder, chronic: Secondary | ICD-10-CM | POA: Diagnosis not present

## 2019-07-13 DIAGNOSIS — F1994 Other psychoactive substance use, unspecified with psychoactive substance-induced mood disorder: Secondary | ICD-10-CM

## 2019-07-13 DIAGNOSIS — F1094 Alcohol use, unspecified with alcohol-induced mood disorder: Secondary | ICD-10-CM

## 2019-07-13 DIAGNOSIS — Z811 Family history of alcohol abuse and dependence: Secondary | ICD-10-CM

## 2019-07-13 DIAGNOSIS — F10980 Alcohol use, unspecified with alcohol-induced anxiety disorder: Secondary | ICD-10-CM

## 2019-07-13 NOTE — Progress Notes (Signed)
    Daily Group Progress Note  Program: CD-IOP   Group Time: 1pm-2:30pm Participation Level: Active Behavioral Response: Appropriate Type of Therapy: Process Group Topic: Clinician checked in with group members, assessing for SI/HI/psychosis and overall level of functioning, including cravings or triggers and coping skills used to deal with uncomfortable feelings. Clinician and group members discussed what went well and could have gone better over the weekend. Group members processed changes in recent stressors. Clinician processed with group connection automatic thoughts relating to use in early recovery. Clinician and group members processed common triggers for relapse in early recovery and planning to address 'surprise triggers.  Group Time: 2:30pm-4pm Participation Level: Active Behavioral Response: Appropriate Type of Therapy: Psycho-education Group Topic: Clinician presented the psycho-educational topic of Adult Children of Alcoholics (ACOA). Clinician reviewed with clients the 'Laundry List' and common thought and behavior patterns of families with substance use in the home. Clinician and group members discussed how this affected believes/behaviors in work, intimate and interpersonal relationships. Clinician and group members discussed specifically the importance or lack of importance of knowing intentions of behaviors. Clinician provided clients with '10 Things Adult Children of Addicts Wants You To Know."   Jesus Taylor, intense trigger, ready to party body Jesus Taylor-saw burbon, disappointed in long ttime to react; talking to wife about triggering thoughts/events, notice acoa traits in wife Jesus Taylor-craving thoughts frequently, about to use distraction, did not go on date; focusing on not being around people still drinking Jesus Taylor- turn down day party, id craving thoughts as natural in early recovery, identify family patterns of acoa in family of origin    Summary: Client checked in,  maintaining sobriety date of 06/20/19, with one AA meeting attended since last session.   Family Program: Family present? No   Name of family member(s): NA; mom requested client accompany her to family wedding event as planned despite behavior contract. Agreed this is acceptable by treatment team.   UDS collected: Yes Results: prescribed naltrexone only  AA/NA attended?: Yes  Sponsor?: No   Jesus Taylor Jesus Delaynie Stetzer, LCSW, LCAS

## 2019-07-14 ENCOUNTER — Encounter (HOSPITAL_COMMUNITY): Payer: BLUE CROSS/BLUE SHIELD

## 2019-07-14 NOTE — Progress Notes (Signed)
Daily Group Progress Note  Program: CD-IOP  Group Time: 1-2:30pm   Participation Level: Active   Behavioral Response: Appropriate   Type of Therapy: Process Group   Topic: Clinician checked in with group members, assessing for SI/HI/psychosis and overall level of functioning, including cravings, threats to sobriety relapse, and number of community support groups attended since last session. Clinician and group members processed 'highs' and 'lows' since last group and how challenges effected their thoughts, behavior, or sobriety. Clinician utilized active listening and provided support throughout check in. Clinician provided guided visualization while encouraging members to engage in mindfulness by noticing their thoughts wandering and bringing them back to the present moment.     Group Time: 2:30pm-4pm   Participation Level: Active   Behavioral Response: Appropriate   Type of Therapy: Psycho-education Group   Topic: Clinician introduced topic of today's group session centered on cognitive distortions. Clinician provided psycho-educational handout and reviewed identified distorted thought processes. Clinician prompted discussion on specific distorted thinking patterns that members find themselves engaging in. Clinician facilitated discussion on how cognitive distortions impact feelings, behaviors, and relationships. Clinician facilitated check-out by assessing for 1 self-care activity that members will engage in prior to next group session.   Summary: Client checked in and reported a maintained sobriety date of 06/20/19 with 1 AA meeting attended since last group session. 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 Client reported that he has noticed these triggers for various restaurants that he would drink at in the past and relayed that he is taking precautions by ordering take out  rather than eating in the establishment. Client engaged once prompted in discussion on cognitive distortions and related to a peer in his pattern of jumping to conclusions which typically results to him acting on impulse. Client reported that he plans to attend a meeting this evening prior to tomorrow's group session.   Family Program: Family present? No   Name of family member(s): n/a  UDS collected: Yes Results: results pending  AA/NA attended?: Yes reported attending 1 meeting since last group session.  Sponsor?: No   Francine Graven, LCSW

## 2019-07-14 NOTE — Progress Notes (Signed)
    Daily Group Progress Note  Program: CD-IOP   Group Time: 1pm-2:30pm Participation Level: Active Behavioral Response: Appropriate Type of Therapy: Process Group Topic: Clinician checked in with group members, assessing for SI/HI/psychosis and overall level of functioning including sobriety dates and community meetings attended. Clinician and group members processed recent successes and any roadblocks to recovery. Clinician and group members processed the effects of taking things personally on situations. Clinician and group members discussed the importance of building new behavior patterns which may help alleviate family concerns about relapse. Group members processed thoughts an feelings about lack of immediate change in family behaviors and believes about changes in behaviors based on previous patterns.   Group Time: 2:30pm-4pm Participation Level: Active Behavioral Response: Appropriate Type of Therapy: Psycho-education Group Topic: Clinician provided supportive material, video on how to not take things personally, by looking at the intention of the other person, and utilizing increased awareness of self to identify any truth in what is being said. Clinician presented the CBT Triangle and the connection of thoughts, feelings, and behaviors. Clinician and group members discussed the importance of identifying thoughts, challenging and changing distorted thinking. Client and group members discussed the difference between this process and the 'rat brain' of 'think it, do it' in addiction. Clinician and group members practiced identifying thoughts about uncomfortable situations and creating alternative thoughts which would lead to behaviors other than substance use.   Summary: Client maintains sobriety date of 06/20/19, attending 1 AA meeting since last group. Clinician met with client individually about showing up late after signing behavior contract to be on time for all groups. Clinician and  client also discussed needing to miss group for a family event, despite behavior contract also stating no more missed groups. Client engaged in discussions when prompted and does show progress toward goals AEB engaging in [facility] meetings and using distraction skills to manage cravings.   Family Program: Family present? No   Name of family member(s): NA  UDS collected: No Results: naltrexone, prescribed  AA/NA attended?: Yes  Sponsor?: No   Olegario Messier, LCSW

## 2019-07-18 ENCOUNTER — Encounter (HOSPITAL_COMMUNITY): Payer: BLUE CROSS/BLUE SHIELD

## 2019-07-20 ENCOUNTER — Other Ambulatory Visit (HOSPITAL_COMMUNITY): Payer: BLUE CROSS/BLUE SHIELD | Admitting: Licensed Clinical Social Worker

## 2019-07-20 ENCOUNTER — Other Ambulatory Visit: Payer: Self-pay

## 2019-07-20 DIAGNOSIS — F4312 Post-traumatic stress disorder, chronic: Secondary | ICD-10-CM | POA: Diagnosis not present

## 2019-07-20 DIAGNOSIS — F10230 Alcohol dependence with withdrawal, uncomplicated: Secondary | ICD-10-CM

## 2019-07-21 ENCOUNTER — Other Ambulatory Visit: Payer: Self-pay

## 2019-07-21 ENCOUNTER — Other Ambulatory Visit (HOSPITAL_COMMUNITY): Payer: BLUE CROSS/BLUE SHIELD | Admitting: Licensed Clinical Social Worker

## 2019-07-21 DIAGNOSIS — F4312 Post-traumatic stress disorder, chronic: Secondary | ICD-10-CM | POA: Diagnosis not present

## 2019-07-21 DIAGNOSIS — F10230 Alcohol dependence with withdrawal, uncomplicated: Secondary | ICD-10-CM

## 2019-07-26 NOTE — Progress Notes (Signed)
    Daily Group Progress Note  Program: CD-IOP   Group Time: 1pm-2:30pm Participation Level: Active Behavioral Response: Appropriate Type of Therapy: Process Group   Topic: Clinician checked in with group members, assessing for SI/HI/psychosis and overall level of functioning. Clinician and group members processed events of the weekend, including highlights and moments of struggle. Clinician and group members processed related thoughts and feelings and coping skills used to address. Clinician and group members read and processed Daily Reflection and relation to current stage of recovery. Clinician and group members processed being put in purposeful and accidental high risk situations and possible responses.     Group Time: 2:30pm-4pm Participation Level: Active Behavioral Response: Appropriate Type of Therapy: Psycho-education Group   Topic: Clinician provided guided relaxation skill in session. Clinician presented psycho-educational information on 'Addictive Personalities' using supporting material from The Substance Abuse and Recovery Workbook. Clinician and group members discussed personality traits relating to self-esteem, relationship dynamics, life skills, and excitement. Clinician and group members discussed effect of substance use on personality traits.    Summary: Client provided update of high risk situation of wedding. Client identified distorted thought patterns leading to alcohol use. Client shows progress toward goals AEB engaging in AA meetings and working to identify activities which lead to feelings of pleasure while sober. Client reports not wanting too many risks outside of focusing on finances and improving work. Client reports sobriety date of 07/18/19.   Family Program: Family present? No   Name of family member(s): NA  UDS collected: Yes Results: NA  AA/NA attended?: Yes  Sponsor?: No   Harlon Ditty, LCSW

## 2019-07-27 ENCOUNTER — Other Ambulatory Visit: Payer: Self-pay

## 2019-07-27 ENCOUNTER — Other Ambulatory Visit (HOSPITAL_COMMUNITY): Payer: BLUE CROSS/BLUE SHIELD | Attending: Psychiatry | Admitting: Licensed Clinical Social Worker

## 2019-07-27 ENCOUNTER — Encounter (HOSPITAL_COMMUNITY): Payer: Self-pay | Admitting: Medical

## 2019-07-27 DIAGNOSIS — K2920 Alcoholic gastritis without bleeding: Secondary | ICD-10-CM | POA: Insufficient documentation

## 2019-07-27 DIAGNOSIS — F10230 Alcohol dependence with withdrawal, uncomplicated: Secondary | ICD-10-CM | POA: Insufficient documentation

## 2019-07-27 DIAGNOSIS — Z6372 Alcoholism and drug addiction in family: Secondary | ICD-10-CM | POA: Diagnosis not present

## 2019-07-27 DIAGNOSIS — T7412XS Child physical abuse, confirmed, sequela: Secondary | ICD-10-CM

## 2019-07-27 DIAGNOSIS — K219 Gastro-esophageal reflux disease without esophagitis: Secondary | ICD-10-CM | POA: Insufficient documentation

## 2019-07-27 DIAGNOSIS — F1094 Alcohol use, unspecified with alcohol-induced mood disorder: Secondary | ICD-10-CM

## 2019-07-27 DIAGNOSIS — Z79899 Other long term (current) drug therapy: Secondary | ICD-10-CM | POA: Insufficient documentation

## 2019-07-27 DIAGNOSIS — Z9119 Patient's noncompliance with other medical treatment and regimen: Secondary | ICD-10-CM | POA: Insufficient documentation

## 2019-07-27 DIAGNOSIS — F1210 Cannabis abuse, uncomplicated: Secondary | ICD-10-CM | POA: Insufficient documentation

## 2019-07-27 DIAGNOSIS — F1994 Other psychoactive substance use, unspecified with psychoactive substance-induced mood disorder: Secondary | ICD-10-CM | POA: Diagnosis not present

## 2019-07-27 DIAGNOSIS — Z811 Family history of alcohol abuse and dependence: Secondary | ICD-10-CM

## 2019-07-27 DIAGNOSIS — F4312 Post-traumatic stress disorder, chronic: Secondary | ICD-10-CM | POA: Diagnosis not present
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
## 2019-07-28 ENCOUNTER — Other Ambulatory Visit (HOSPITAL_COMMUNITY): Payer: BLUE CROSS/BLUE SHIELD | Admitting: Licensed Clinical Social Worker

## 2019-07-28 DIAGNOSIS — F10230 Alcohol dependence with withdrawal, uncomplicated: Secondary | ICD-10-CM

## 2019-07-28 NOTE — Progress Notes (Signed)
    Daily Group Progress Note  Program: CD-IOP   Group Time: 1pm-2:30pm Participation Level: Active Behavioral Response: Appropriate Type of Therapy: Process Group  Topic: Clinician checked in with group members, assessing for SI/HI/psychosis and overall level of functioning including relapse and barriers to recovery. Clinician and group members discussed highlights and struggles since last group related to maintaining sobriety including identified triggers and responses. Clinician and group members processed Daily Reflection on 'Sick and Tired' and motivation to engage fully in recovery vs only maintaining sobriety.   Group Time: 2:30pm-4pm Participation Level: Active Behavioral Response: Appropriate Type of Therapy: Psycho-education Group  Topic: Clinician presented psycho-educational information from [facility] curriculum with Roadblocks to Healthy Thinking 'Ways of Thinking That Keep You Stuck.' Clinician facilitated conversation related to how distorted thinking can effect recovery and provider 'tricky thoughts' or permission statements for use. Clinician reminded group members of Cognitive Triangle and the connection between thoughts, feelings, and behaviors. Clinician and group members practiced challenging distorted thinking and developing new thinking habits. Clinician closed requested a self-care activity to support recovery planned before next group.     Summary: Client reported sobriety date of 07/18/19 with no alcohol use since the wedding. Client reported health concerns more problematic, see PA note for additional details. Client identified being able to stay busy over the weekend to avoid use. Client shows progress toward goals AEB engaging with AA and identifying previously used 'excuses' for drinking and alternative self talk.   Family Program: Family present? No   Name of family member(s): NA  UDS collected: Yes Results: pending  AA/NA attended?: Yes, 1 mtg since last  group session  Sponsor?: No   Lety Cullens A Lavonya Hoerner, LCSW, LCAS

## 2019-07-29 NOTE — Progress Notes (Signed)
    Daily Group Progress Note  Program: CD-IOP   Group Time: 1pm-2:30pm Participation Level: Active Behavioral Response: Appropriate Type of Therapy: Process Group   Topic: Clinician checked in with group members, assessing for SI/HI/psychosis and overall level of functioning including relapse and barriers to recovery. Clinician and group members discussed highlights and struggles since last group related to maintaining sobriety including identified triggers and responses. Clinician and group members processed fear in early recovery and its effects on decision making. Client's also discussed how shame played a factor in seeking help or continuing to seek help following a relapse.     Group Time: 2:30pm-4pm Participation Level: Active Behavioral Response: Appropriate Type of Therapy: Psycho-education Group   Topic: Co-facilitator for psycho-educational group from [facility]. Guest provided psycho-educational information on health and wellness in relation to mental health. Topics included sleep hygiene, healthy eating habits, and benefits to exercise. Clinician reminded clients of HALT-BS and the effects of those on short and long term decision making vs impulsive behaviors. Clinician closed requested a self-care activity to support recovery planned before next group.   Summary: Client arrived late, in violation of behavior contract, citing medical issues continuing. Client processed fear in relation to anger, noting lack of fear when angry causing decreased healthy decision making and increased impulsive or reckless behaviors. Client reported goal to return to exsersice which was previously helpful with managing mood.   Family Program: Family present? No   Name of family member(s): NA  UDS collected: No Results: pending  AA/NA attended?: No  Sponsor?: No   Harlon Ditty, LCSW

## 2019-07-31 NOTE — Progress Notes (Signed)
    Daily Group Progress Note  Program: CD-IOP   Group Time: 1pm-2:30pm Participation Level: Active Behavioral Response: Appropriate Type of Therapy: Process Group Topic: Clinician checked in with group members, assessing for SI/HI/psychosis and overall level of functioning including cravings, relapse, and community meeting attendance. Clinician and group members processed 'highs' and 'lows' and any barriers to maintaining sobriety. Clinician and group members read and processed Daily Reflection and relation to current stage of recovery.   Group Time: 2:30pm-4pm Participation Level: Active Behavioral Response: Appropriate Type of Therapy: Psycho-education Group Topic: Clinician and group members completed Addictive Personality session. Clinician and group members discussed healthy ways to get needs met, including 'rush' previously earned from substance use. Clinician presented AA Holiday guide with tips for avoiding relapse over the holiday weekend.    Summary: Client checked in with sobriety date of 07/18/19. Client shows progress toward goal AEB increased engagement in group discussions. Client was able to identify several things in his life which he felt proud of and people who were supportive. Client shared plans to maintain sobriety over the holiday by attending BBQs with his family only.   Family Program: Family present? No   Name of family member(s): NA  UDS collected: No Results: 07/20/19, positive for naltrexone  AA/NA attended?: No  Sponsor?: No   Karissa A Brone, LCSW        

## 2019-08-01 ENCOUNTER — Other Ambulatory Visit (HOSPITAL_COMMUNITY): Payer: BLUE CROSS/BLUE SHIELD | Admitting: Licensed Clinical Social Worker

## 2019-08-01 ENCOUNTER — Other Ambulatory Visit: Payer: Self-pay

## 2019-08-01 DIAGNOSIS — F10230 Alcohol dependence with withdrawal, uncomplicated: Secondary | ICD-10-CM

## 2019-08-02 NOTE — Progress Notes (Signed)
    Daily Group Progress Note  Program: CD-IOP   Group Time: 1pm-2:30pm  Participation Level: Miminal Behavioral Response: Appropriate Type of Therapy: Process Group   Topic: Clinician met with clients, assessing for SI/HI/psychosis and overall level of functioning including attendance of recovery meetings and relapse or challenges to sobriety. Clinician and group members discussed highs and lows from previous days and reflection on topic from previous day. Clinician and group members read Daily Reflection and clinician facilitated discussion on topic related to recovery.    Group Time: 2:30pm-4pm Participation Level: Active  Behavioral Response: Appropriate Type of Therapy: Psycho-education Group Topic: Clinician provided psycho-educational group on PAWS. Clinician provided supplemental video with discussion on common PAWS symptoms and skills to help with management. Clinician utilized Post Acute Withdrawal (PAW) Self-Evaluation developed by Patrina Levering. Clinician and group members discussed the importance of being aware and tracking symptoms during recovery. Clinician provided supplemental video of information related to PAWS, common triggers, and relaxation techniques for management of symptoms. Clinician provided exercises for mindfulness as an alternative to meditations.    Summary: Client maintained sobriety date of 07/18/19. Client reports joining a study group and scheduling his GC exam. Client is receptive to psycho-educational information and shows progress toward goals by maintaining sobriety and engaging in [employer].   Family Program: Family present? No   Name of family member(s): NA  UDS collected: Yes Results: pending  AA/NA attended?: Yes, 1. No sponsor  Olegario Messier, LCSW

## 2019-08-03 ENCOUNTER — Other Ambulatory Visit (HOSPITAL_COMMUNITY): Payer: BLUE CROSS/BLUE SHIELD | Admitting: Medical

## 2019-08-03 ENCOUNTER — Other Ambulatory Visit: Payer: Self-pay

## 2019-08-03 DIAGNOSIS — K2100 Gastro-esophageal reflux disease with esophagitis, without bleeding: Secondary | ICD-10-CM

## 2019-08-03 DIAGNOSIS — K2920 Alcoholic gastritis without bleeding: Secondary | ICD-10-CM

## 2019-08-03 NOTE — Progress Notes (Signed)
Patient ID: Jesus Taylor, male   DOB: 05/05/1990, 29 y.o.   MRN: [id number] S-Counselors reported at team meeting patient is having stomach problems affecting his group participation. He has hx of GERD but has never been endoscoped. He says his discomg fort is epigastric and related to certain foods especially pizza and lays down flat. Marland Kitchen He also admits he does not elevate the head of his bed as he has in the past. He is considering resuming Nexium.He did get any PTC Pepcid. His weight has been stable O- Alert Oriented Appropriate and Comprehensible Abdomen is flat nontender with voluntary guarding. No organomegaly.Marland Kitchen A- Emperically probable combination of alcoholic gastritis and GERD.He is not interested in Endoscopy P- Rx Pepcid 40 mg QD for 6 weeks Raise head of bed  Diet. FU with PCP

## 2019-08-04 ENCOUNTER — Other Ambulatory Visit: Payer: Self-pay

## 2019-08-04 ENCOUNTER — Other Ambulatory Visit (HOSPITAL_COMMUNITY): Payer: BLUE CROSS/BLUE SHIELD

## 2019-08-05 ENCOUNTER — Encounter (HOSPITAL_COMMUNITY): Payer: Self-pay | Admitting: Medical

## 2019-08-05 MED ORDER — FAMOTIDINE 40 MG PO TABS: 40.0000 mg | ORAL_TABLET | Freq: Every evening | ORAL | 1 refills | Status: DC

## 2019-08-08 ENCOUNTER — Encounter (HOSPITAL_COMMUNITY): Payer: BLUE CROSS/BLUE SHIELD

## 2019-08-10 ENCOUNTER — Other Ambulatory Visit (HOSPITAL_COMMUNITY): Payer: BLUE CROSS/BLUE SHIELD

## 2019-08-10 ENCOUNTER — Other Ambulatory Visit: Payer: Self-pay

## 2019-08-11 ENCOUNTER — Encounter (HOSPITAL_COMMUNITY): Payer: BLUE CROSS/BLUE SHIELD

## 2019-08-15 ENCOUNTER — Other Ambulatory Visit (HOSPITAL_COMMUNITY): Payer: BLUE CROSS/BLUE SHIELD | Admitting: Licensed Clinical Social Worker

## 2019-08-15 ENCOUNTER — Other Ambulatory Visit: Payer: Self-pay

## 2019-08-15 ENCOUNTER — Encounter (HOSPITAL_COMMUNITY): Payer: Self-pay | Admitting: Medical

## 2019-08-15 DIAGNOSIS — F4312 Post-traumatic stress disorder, chronic: Secondary | ICD-10-CM

## 2019-08-15 DIAGNOSIS — Z91199 Patient's noncompliance with other medical treatment and regimen due to unspecified reason: Secondary | ICD-10-CM

## 2019-08-15 DIAGNOSIS — K2920 Alcoholic gastritis without bleeding: Secondary | ICD-10-CM

## 2019-08-15 DIAGNOSIS — F1210 Cannabis abuse, uncomplicated: Secondary | ICD-10-CM

## 2019-08-15 DIAGNOSIS — Z8719 Personal history of other diseases of the digestive system: Secondary | ICD-10-CM

## 2019-08-15 DIAGNOSIS — F10230 Alcohol dependence with withdrawal, uncomplicated: Secondary | ICD-10-CM

## 2019-08-15 DIAGNOSIS — F1094 Alcohol use, unspecified with alcohol-induced mood disorder: Secondary | ICD-10-CM

## 2019-08-15 DIAGNOSIS — Z811 Family history of alcohol abuse and dependence: Secondary | ICD-10-CM

## 2019-08-15 NOTE — Progress Notes (Signed)
Daily Group Progress Note   Program: CD-IOP     Group Time: 1pm-2:30pm  Participation Level: Miminal Behavioral Response: Appropriate Type of Therapy: Process Group  Topic: Clinician checked in with group members, assessing for SI/HI/psychosis and overall level of functioning. Clinician and group members processed 'highs' and 'lows' and any challenges related to recovery. Clinician and group members read and reviewed NA's 'Just for Today' and AA's 'Daily Reflection' with focus on becoming more honest each day with oneself and supports, as well as finding freedom from fear.  Clinician facilitated conversation on issues clients have had with dishonesty in their recovery, as well as any fears they currently have which might undermine recovery efforts and needs to be addressed.       Group Time: 2:30pm-4pm Participation Level: Active  Behavioral Response: Appropriate Type of Therapy: Psycho-education Group Topic: Clinician provided handout to clients with a poem titled "A letter from my disease".  This poem was written from the perspective of one's addiction, and explained how it can manifest in several different ways (i.e. drugs, alcohol, gambling, overeating, etc); justifications people rely on for continued use (i.e. numbing oneself from pain or unwanted emotions, escaping from problems, instant gratification) and strategies for strengthening relapse prevention plan such as attending 12 step meetings, acquiring a sponsor, and sober support network. Clinician inquired about clients' perception of the poem, what they found relatable, and what steps they can begin taking today to reinforce focus on healthier, sober lifestyle.        Summary: Jesus Taylor presented 30 minutes late to group, but was alert, oriented x5, with no evidence or self-report of SI/HI or A/V H.  Jesus Taylor reported that he has maintained sobriety since 07/18/19 and attended 1 recovery meeting over the weekend. Jesus Taylor reported that  his success has been staying on track with his goal of acquiring real estate and [employer] by studying for his exam, as well as working part-time to support self.  He reported that his challenge has been managing impatience, as he struggles with desire for instant gratification. Jesus Taylor reported that he found the NA 'Just for today' passage meaningful, as he has been dishonest while in active addiction and stated "I'm learning to be more honest with myself and make changes so I can be a better person".  Jesus Taylor was observed looking at his phone frequently in first portion of group and stepped out momentarily to make a phone call.  Darlyne Russian, PA-C was informed of this engagement issue, and spoke with him separately to address this (see additional note in chart).  Upon returning, Jesus Taylor was more active in discussion, and abstained from looking at phone.  Intervention was effective, as evidenced by Jesus Taylor reporting that he found the poem to be profound and relatable, as he reached a low point 3 years ago where he almost killed himself due to the impact his use had upon his mental health.  Jesus Taylor reported that reliance upon his support network helped get him back on track, and he is now trying to maintain a sober lifestyle by focusing upon a daily routine of studying for upcoming exam, meditating, and expressing his feelings to close family and friends instead of bottling them up.       Family Program: Family present? No              Name of family member(s): NA   UDS collected: No Results: N/A   AA/NA attended?: Yes, 1 meeting.  Sponsor: No    Tommi Rumps  Jenne Pane, LCSW, LCAS 08/15/19
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
## 2019-08-17 ENCOUNTER — Ambulatory Visit (HOSPITAL_COMMUNITY): Payer: BLUE CROSS/BLUE SHIELD | Admitting: Licensed Clinical Social Worker

## 2019-08-17 DIAGNOSIS — F10230 Alcohol dependence with withdrawal, uncomplicated: Secondary | ICD-10-CM | POA: Diagnosis not present

## 2019-08-18 ENCOUNTER — Other Ambulatory Visit: Payer: Self-pay

## 2019-08-18 ENCOUNTER — Other Ambulatory Visit (HOSPITAL_COMMUNITY): Payer: BLUE CROSS/BLUE SHIELD | Admitting: Licensed Clinical Social Worker

## 2019-08-18 DIAGNOSIS — F10230 Alcohol dependence with withdrawal, uncomplicated: Secondary | ICD-10-CM

## 2019-08-18 DIAGNOSIS — F1094 Alcohol use, unspecified with alcohol-induced mood disorder: Secondary | ICD-10-CM

## 2019-08-18 NOTE — Progress Notes (Signed)
    Daily Group Progress Note  Program: CD-IOP   Group Time: 1pm-2:30pm  Participation Level: Minimal  Behavioral Response: Appropriate  Type of Therapy: Process Group  Topic: Clinician checked in with group members, assessing for SI/HI/psychosis and overall level of functioning. Clinician inquired about changes in recent stressors and any barriers to maintaining sobriety. Clinician and group members process topics related to recent stressors and discussed Radical Acceptance as a part of recovery process. Clinician and group members read and reviewed Just For Today related to Surrender.  Group Time: 2:30pm-4pm  Participation Level: Minimal  Behavioral Response: Appropriate  Type of Therapy: Psycho-education Group  Topic: Clinician presented psycho educational information on Justifications for Relapse from [employer]riculum. Clinician and group members identified common 'sneaky thoughts' used to justify a relapse. Clinician and group members worked to identify healthy replacement thoughts. Clinician and group members discussed using realistic self talk as a response to automatic addict thinking to improve decision making. Clinician presented Distress Tolerance TIPP skill. Clinician practiced in session Progressive Muscle Relaxation with group members.   Summary: Jesus Taylor presented late to session, despite behavior contract reminder. Client reported maintained sobriety date. Client denies trouble with cravings or urges to use. Client reports a focus on finding a new living arrangement. Client reported no concern with being offered alcohol as a justification as he believes he can say no, but identified cannot be around open alcohol in front of him. Client reports excessive anger could also be a justification for relapse if not managed by leaving a situation to calm down.    Family Program: Family present? No   Name of family member(s): NA  UDS collected: No Results: NA  AA/NA attended?:  No  Sponsor?: No   Harlon Ditty, LCSW

## 2019-08-21 NOTE — Progress Notes (Signed)
    Daily Group Progress Note  Program: CD-IOP  Group Time: 1pm-2:00pm  Participation Level: Active  Behavioral Response: Appropriate  Type of Therapy: Psycho-education Group  Topic: Clinician checked in with clients assessing for SI/HI/psychosis and overall level of functioning. Psycho-educational group facilitated by Pharmacist from [facility]. Pharmacist provided information on medications, side effects, and interactions. Clients were provided with time to as questions about medications. Pharmacist provided feedback on medications to help with cravings and encouraged the need for behavioral intervention in addition to medication changes.        Group Time: 2:00pm-4pm  Participation Level: Active  Behavioral Response: Appropriate  Type of Therapy: Process Group  Topic: Check in was continued with things going well, things which could be going better, and anything threatening sobriety. Clinician and group members processed recent life events causing stress and related thoughts and feelings. Clinician and group members read and processed NA's Just for today with a focus on Tolerance and acceptance of character defects in self and others. Clinician and group members processed recent interactions with family members and solicited feedback on individual responses. Clinician provided Distress Tolerance handouts for review. Clinician and group members planned self-care activities to support recovery over the weekend. Clinician and group celebrated one group member graduation.   Summary: Danice Goltz maintained sobriety date of 07/18/19 with 1 AA meeting attended since last session. Client reported no temptation to drink on date from previous night. Client plans to move into an apartment of his own in August. Client was receptive to education from pharmacist. Client identified several distraction skills used including driving with the windows down to de-escalate self.  Client plans to  study for [facility] exam again over the weekend.   Family Program: Family present? No   Name of family member(s): NA  UDS collected: No Results: N/A  AA/NA attended?: No  Sponsor?: No   Harlon Ditty, LCSW

## 2019-08-22 ENCOUNTER — Other Ambulatory Visit: Payer: Self-pay

## 2019-08-22 ENCOUNTER — Other Ambulatory Visit (HOSPITAL_COMMUNITY): Payer: BLUE CROSS/BLUE SHIELD | Admitting: Licensed Clinical Social Worker

## 2019-08-22 DIAGNOSIS — F10230 Alcohol dependence with withdrawal, uncomplicated: Secondary | ICD-10-CM | POA: Diagnosis not present

## 2019-08-22 DIAGNOSIS — F1094 Alcohol use, unspecified with alcohol-induced mood disorder: Secondary | ICD-10-CM

## 2019-08-23 NOTE — Progress Notes (Signed)
    Daily Group Progress Note  Program: CD-IOP   Group Time: 1pm-2:30pm  Participation Level: Active Behavioral Response: Appropriate Type of Therapy: Process Group Topic:   Clinician checked in with group members, assessing for SI/HI/psychosis and overall level of functioning including triggers, cravings, or relapse. Clinician and group members reviewed sobriety date, community meetings attended, and positive interactions as well as interactions which could have gone better and how they were addressed. Clinician and group members read and discussed AA Daily Reflection and NA Just For Today. Clinician provided client information on levels of care in substance use. Clinician and group members completed and processed My Mission: Celebrating Life and Love, with a focus on acceptance of self following previous discussions on self-compassion and radical acceptance. Clinician provided 'A Handful of Quiet' Meditation to complete in session.   Group Time: 2:30pm-4pm Participation Level: Active Behavioral Response: Appropriate Type of Therapy: Psycho-education Group Topic: Clinician presented Happy Brain: How to Overcome Our Neural Predispositions to Suffering by Dr. Verna Czech. Clinician presented psycho-educational information on automatic thinking and neural predispositions. Group members discussed negative adaptations including mind wandering, negativity bias. Educational information also included skills to improve moments of happiness by focusing on gratitude skills, non-judgmental stance, and reframing challenges to focus on compassion, acceptance, meaning, and forgiveness. Clinician and group members discussed recent life stressors and how these skills could be applied. Clinician presented the topic of mindfulness in conjunction with psycho-educational materials focused vs wandering modes of thinking. Clinician and group members discussed core features of mindfulness and barriers for  implementation.   Summary: Client reported un-eventful weekend and identifies progress as starting to enjoy activities without the need for alcohol. Client reported previously feeling he needed alcohol to enjoy activities. Client mission statement was consistent with goals reported in group of success in work and gaining financial independence. Client is receptive to psycho-educational information. Client sobriety date remained 07/18/19 with 1 AA meeting attended since last group session.   Family Program: Family present? No   Name of family member(s): NA  UDS collected: No Results: NA  AA/NA attended?: Yes  Sponsor?: No   Anzlee Hinesley A Makeya Hilgert, LCSW, LCAS

## 2019-08-24 ENCOUNTER — Other Ambulatory Visit: Payer: Self-pay

## 2019-08-24 ENCOUNTER — Other Ambulatory Visit (HOSPITAL_COMMUNITY): Payer: BLUE CROSS/BLUE SHIELD | Admitting: Licensed Clinical Social Worker

## 2019-08-24 DIAGNOSIS — F10230 Alcohol dependence with withdrawal, uncomplicated: Secondary | ICD-10-CM

## 2019-08-24 DIAGNOSIS — F1094 Alcohol use, unspecified with alcohol-induced mood disorder: Secondary | ICD-10-CM

## 2019-08-25 ENCOUNTER — Other Ambulatory Visit (HOSPITAL_COMMUNITY): Payer: BLUE CROSS/BLUE SHIELD | Attending: Psychiatry | Admitting: Licensed Clinical Social Worker

## 2019-08-25 DIAGNOSIS — F1094 Alcohol use, unspecified with alcohol-induced mood disorder: Secondary | ICD-10-CM | POA: Diagnosis not present

## 2019-08-25 DIAGNOSIS — F10230 Alcohol dependence with withdrawal, uncomplicated: Secondary | ICD-10-CM | POA: Diagnosis not present

## 2019-08-25 DIAGNOSIS — Z79899 Other long term (current) drug therapy: Secondary | ICD-10-CM | POA: Diagnosis not present

## 2019-08-25 DIAGNOSIS — Z6372 Alcoholism and drug addiction in family: Secondary | ICD-10-CM | POA: Insufficient documentation

## 2019-08-25 DIAGNOSIS — K2920 Alcoholic gastritis without bleeding: Secondary | ICD-10-CM | POA: Insufficient documentation

## 2019-08-25 DIAGNOSIS — F4312 Post-traumatic stress disorder, chronic: Secondary | ICD-10-CM | POA: Insufficient documentation

## 2019-08-25 DIAGNOSIS — F1210 Cannabis abuse, uncomplicated: Secondary | ICD-10-CM | POA: Diagnosis not present

## 2019-08-25 DIAGNOSIS — F1994 Other psychoactive substance use, unspecified with psychoactive substance-induced mood disorder: Secondary | ICD-10-CM | POA: Insufficient documentation

## 2019-08-25 DIAGNOSIS — Z811 Family history of alcohol abuse and dependence: Secondary | ICD-10-CM | POA: Insufficient documentation

## 2019-08-30 NOTE — Progress Notes (Signed)
    Daily Group Progress Note  Program: CD-IOP   Group Time: 1pm-2:30pm Participation Level: Active Behavioral Response: Appropriate Type of Therapy: Process Group Topic: Clinician checked in with clients, assessing for SI/HI/psychosis and overall level of functioning. Clinician and group members discussed 'highs' and 'lows' over the weekend, including any triggers or coping skills used. Clinician and group members processed the effect of recovery vs active addition in responding to stressful events from the week. Clinician and group members read and responded to AA Daily Reflection. Clinician provided mindfulness activity focused on grounding and being present in the moment.   Group Time: 2:30pm-4pm Participation Level: Active Behavioral Response: Appropriate Type of Therapy: Psycho-education Group Topic: Clinician presented psycho-educational material on Distorted thinking styles. Clinician and group members reviewed examples of each distortion. Clinician and group members identified recent cognitive distortions and practiced in session creating alternate thought patterns. Clinician and group members discussed practicing small changes daily creating long term changes in beliefs. Clinician inquired about self-care activity planned for the evening.    Summary: Client maintains sobriety date and shows progress toward goals by continued engagement in AA, though has not found a sponsor. Client is able to identify personally used cognitive distortions both sober and in recovery. Client demonstrated ability to create alternative thoughts, reframing distortions.   Family Program: Family present? No   Name of family member(s): NA  UDS collected: Yes Results: no substances found  AA/NA attended?: Yes, 1  Sponsor?: no   Harlon Ditty, LCSW

## 2019-08-31 ENCOUNTER — Encounter (HOSPITAL_COMMUNITY): Payer: Self-pay | Admitting: Medical

## 2019-08-31 ENCOUNTER — Other Ambulatory Visit (INDEPENDENT_AMBULATORY_CARE_PROVIDER_SITE_OTHER): Payer: BLUE CROSS/BLUE SHIELD | Admitting: Licensed Clinical Social Worker

## 2019-08-31 ENCOUNTER — Other Ambulatory Visit: Payer: Self-pay

## 2019-08-31 DIAGNOSIS — Z811 Family history of alcohol abuse and dependence: Secondary | ICD-10-CM

## 2019-08-31 DIAGNOSIS — F1994 Other psychoactive substance use, unspecified with psychoactive substance-induced mood disorder: Secondary | ICD-10-CM

## 2019-08-31 DIAGNOSIS — F10230 Alcohol dependence with withdrawal, uncomplicated: Secondary | ICD-10-CM

## 2019-08-31 DIAGNOSIS — F1094 Alcohol use, unspecified with alcohol-induced mood disorder: Secondary | ICD-10-CM

## 2019-08-31 DIAGNOSIS — F4312 Post-traumatic stress disorder, chronic: Secondary | ICD-10-CM

## 2019-08-31 DIAGNOSIS — F1210 Cannabis abuse, uncomplicated: Secondary | ICD-10-CM

## 2019-08-31 DIAGNOSIS — K2920 Alcoholic gastritis without bleeding: Secondary | ICD-10-CM

## 2019-08-31 MED ORDER — SUCRALFATE 1 G PO TABS: 1.0000 g | ORAL_TABLET | Freq: Four times a day (QID) | ORAL | 1 refills | Status: AC

## 2019-08-31 MED ORDER — BACLOFEN 10 MG PO TABS: 10.0000 mg | ORAL_TABLET | Freq: Three times a day (TID) | ORAL | 1 refills | Status: AC

## 2019-08-31 MED ORDER — NALTREXONE HCL 50 MG PO TABS: 50.0000 mg | ORAL_TABLET | Freq: Every day | ORAL | 0 refills | Status: AC

## 2019-08-31 MED ORDER — FAMOTIDINE 40 MG PO TABS: 40.0000 mg | ORAL_TABLET | Freq: Every evening | ORAL | 1 refills | Status: AC
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
## 2019-09-01 IMAGING — DX DG CHEST 2V
2 series · 2 of 2 positions shown · non-contrast
Comparison: None

CLINICAL DATA: Central chest tightness, pain radiating down LEFT
arm today, history GERD

EXAM:
CHEST  2 VIEW

[chest pa]
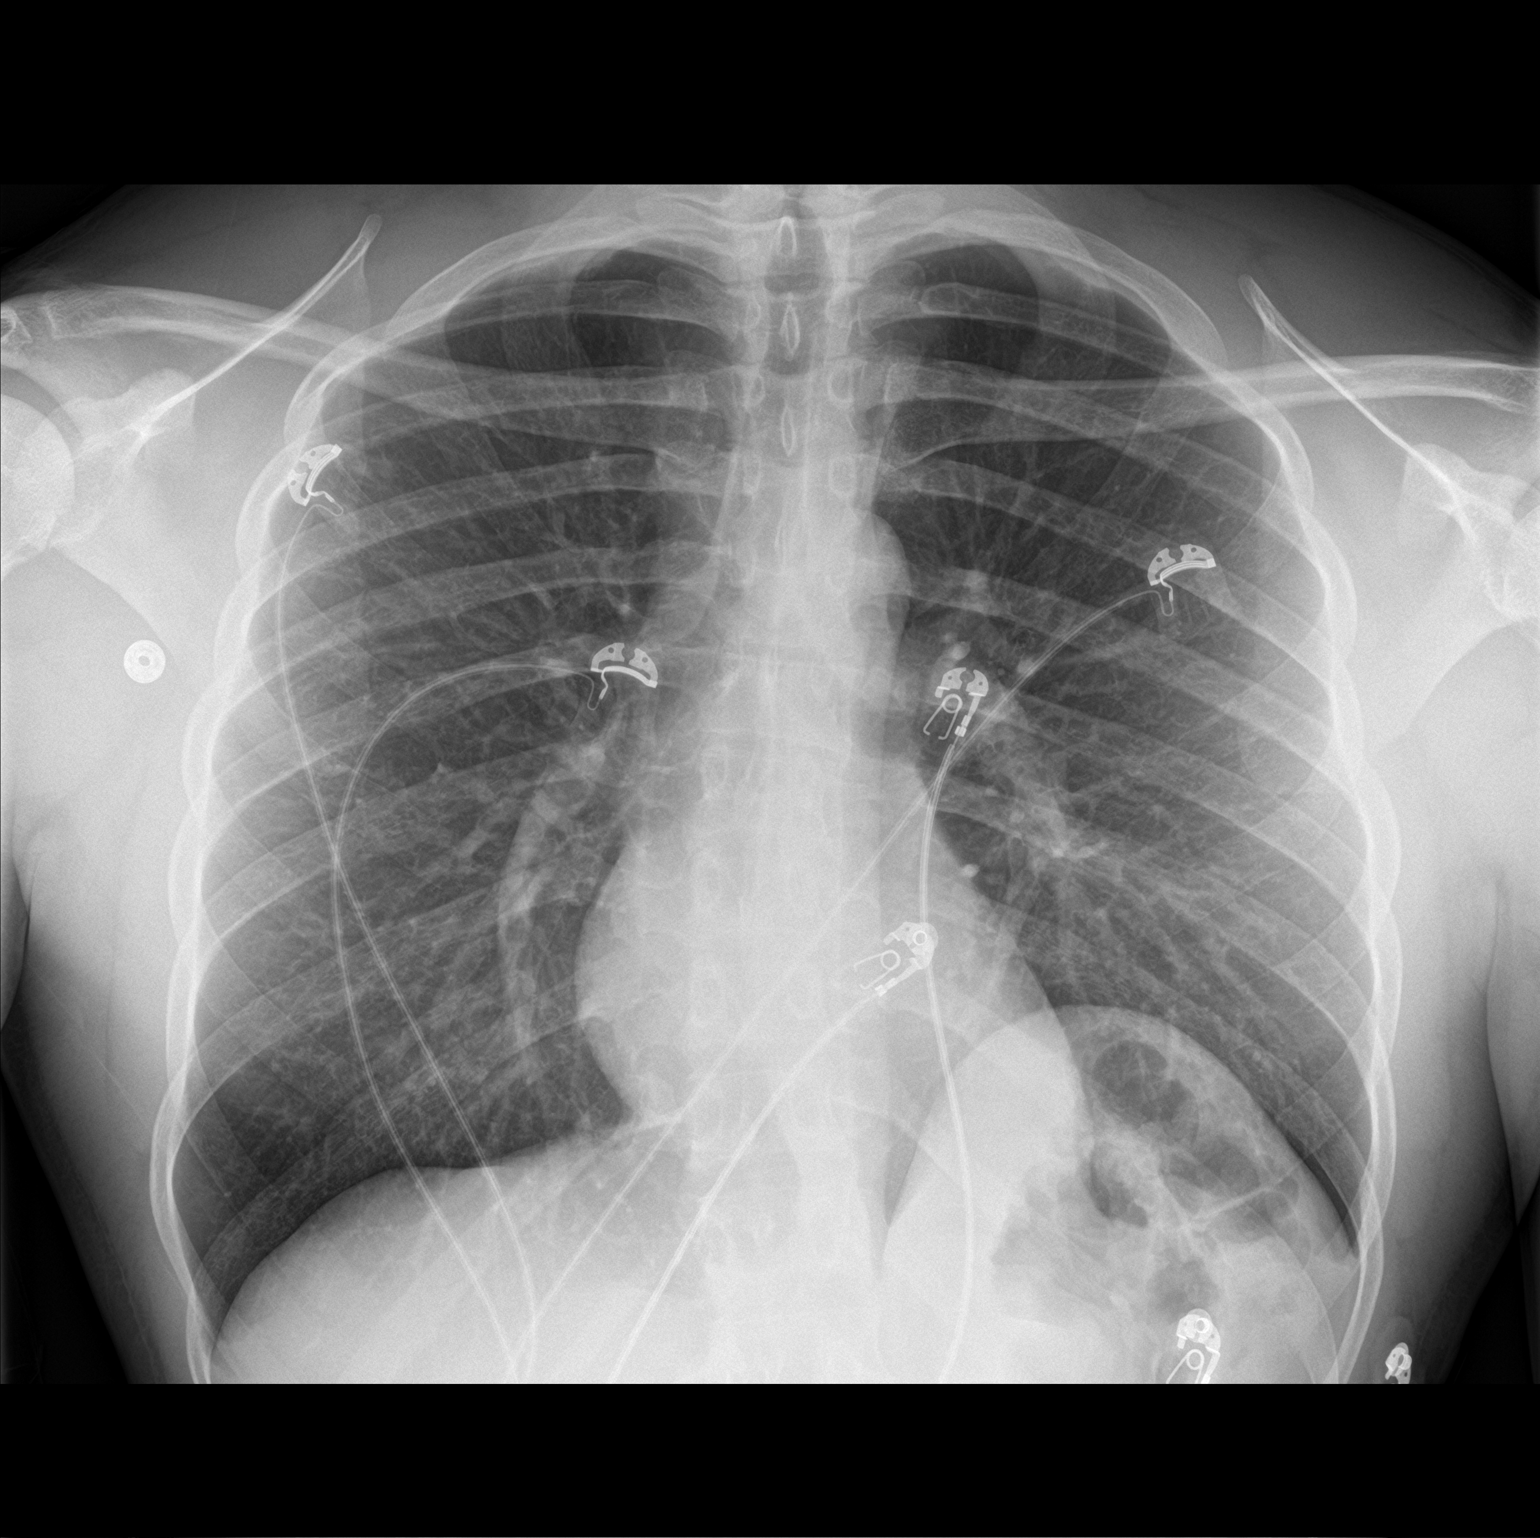

[chest lat]
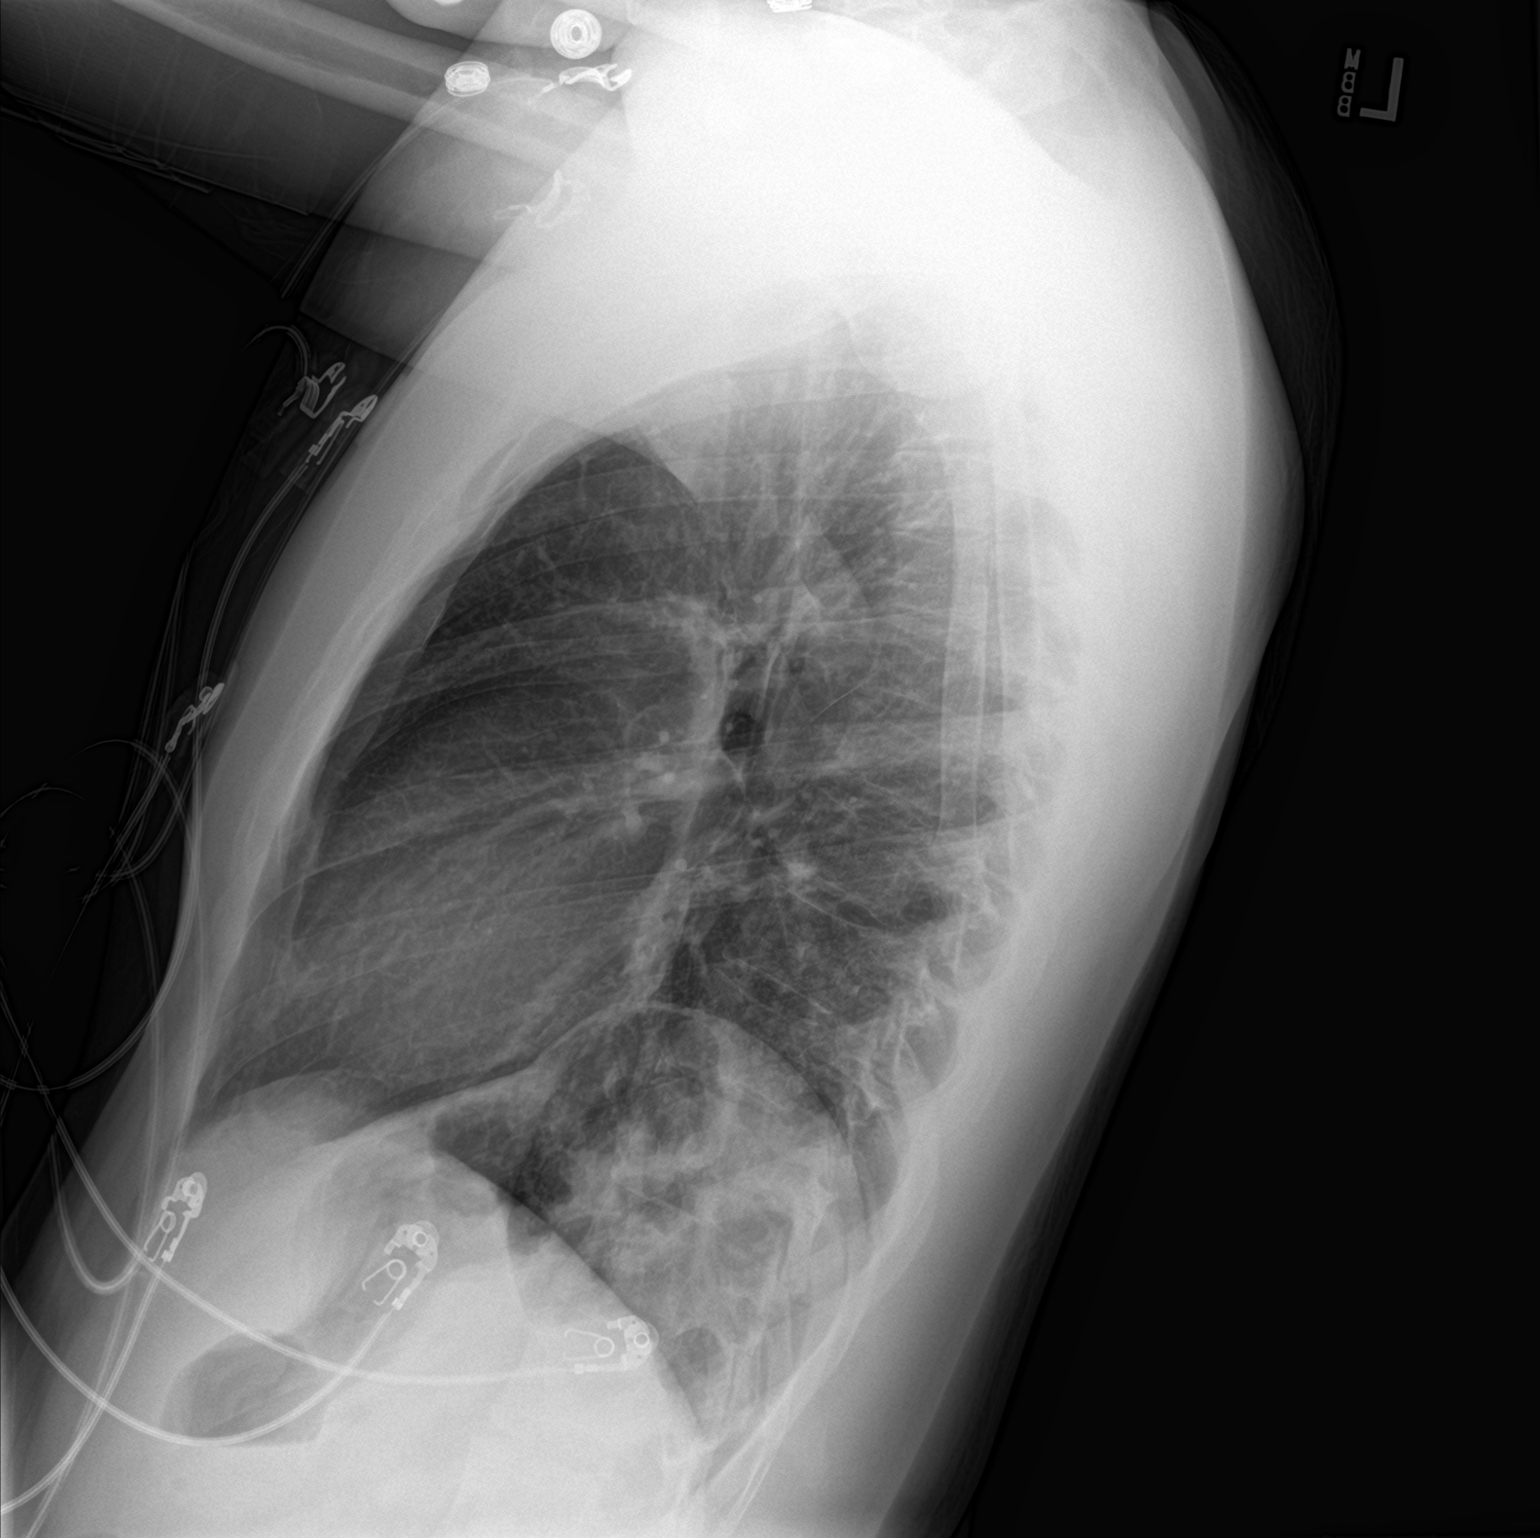

[2 of 2 positions shown; findings below may reference images not displayed]

FINDINGS: Normal heart size, mediastinal contours, and pulmonary vascularity.

Eventration of posterior LEFT diaphragm noted.

Lungs clear.

No pleural effusion or pneumothorax.

Bones unremarkable.
IMPRESSION: No acute abnormalities.

## 2019-09-02 NOTE — Progress Notes (Signed)
    Daily Group Progress Note  Program: CD-IOP   Group Time: 1pm-2pm Participation Level: Active Behavioral Response: Appropriate Type of Therapy: Process Group   Topic: Clinician checked in with group members, assessing for SI/HI/psychosis and overall level of functioning including relapse and barriers to recovery. Clinician and group members discussed highlights and struggles since last group related to maintaining sobriety including identified triggers and responses. Clinician and group members processed Daily Reflection and  personal meeting to current work in recovery. Clinician provided mindfulness activity.     Group Time: 2pm-4pm Participation Level: Active Behavioral Response: Appropriate Type of Therapy: Psycho-education Group   Topic: Clinician and group members reviewed accomplishments and needs assessment, including physical, emotional, cognitive, and social needs. Clinician and group members discussed what it looks like to have needs met, and what it looks like when their personal needs are not met. Clinician facilitated discussion on self-awareness of feelings, unmet needs, and fears related to asking for help. Clinician facilitated discussion on meeting needs in healthy vs unhealthy way. Clinician and group members discussed breaking unhealthy patterns of getting needs met and provided examples of helpful and unhelpful needs instructions. Clinician inquired about self care plans for the holiday weekend.     Summary: Client maintained sobriety date of 07/18/19. Client reported plans to stay at home with family over the holiday weekend and spend time with girlfriend before she leaves for work. Client reports avoiding boredom by studying for exams. Client identified concerns when younger about asking for help, but now is willing to utilize resources of others after attempting to problem solve on his own.   Family Program: Family present? No   Name of family member(s):  NA  UDS collected: No Results: pending AA/NA attended?: Yes  Sponsor?: No   Olegario Messier, LCSW

## 2019-09-02 NOTE — Progress Notes (Signed)
° ° °  Daily Group Progress Note  Program: CD-IOP   Group Time: 1-2:30pm Participation Level: Active  Behavioral Response: Appropriate Type of Therapy: Process Group   Topic: Clinician checked in with group members, assessing for SI/HI/psychosis and overall level of functioning, including cravings, relapse, and participation in community support meetings. Clinician to identify and process highs and lows since last group. Clinician and group members read Just for Today and Daily Reflection and discussed results of decisions based on fear/anxiety or unrealistic expectations.  Group Time: 2:30-4pm Participation Level: Active Behavioral Response: Appropriate Type of Therapy: Psycho-educational Group   Clinician presented Addiction Neuroscience 101 video from [employer] [facility] [employer]. Clinician and group members discussed scientific link between addiction, dopamine, and motivation. Clinician and group members discussed examples of changes in motivation levels in their own recovery. Clinician and group members brain stormed personal activities to engage in which increase moments of joy, stimulating the pleasure center of the brain. Clinician reviewed diagnosis criteria and decision fatigue based on information from video. Group members identified self care activity to support recovery to be completed over the weekend.  Group celebrated one [employer] graduation.    Summary: Client reported sobriety date of 07/18/19. Client completes CDIOP program on this day. Client is receptive to psycho-educational information. Client has met goal of achieving sobriety. Client has external goals of studying and taking tests to further employment and financial goals. Client will be provided with resources for individual therapy if desired in the future. Client will schedule appointment with Darlyne Russian, PA-C for medication management services.   Family Program: Family present? No   Name of family  member(s): NA  UDS collected: Yes Results: pending. Results from previous week spilled in bag, not tested and marked quantity  Not sufficient.  AA/NA attended?: Yes  Sponsor?: No   Olegario Messier, LCSW

## 2019-09-05 ENCOUNTER — Other Ambulatory Visit (HOSPITAL_COMMUNITY): Payer: BLUE CROSS/BLUE SHIELD

## 2019-09-05 ENCOUNTER — Other Ambulatory Visit: Payer: Self-pay

## 2019-09-12 ENCOUNTER — Telehealth (HOSPITAL_COMMUNITY): Payer: Self-pay | Admitting: Medical

## 2019-09-26 ENCOUNTER — Encounter (HOSPITAL_COMMUNITY): Payer: BLUE CROSS/BLUE SHIELD

## 2019-10-10 ENCOUNTER — Telehealth (HOSPITAL_COMMUNITY): Payer: Self-pay

## 2019-10-10 NOTE — Telephone Encounter (Signed)
Spoke with [employer] to do an appeal on patient's Famotidine 40mg  that was denied. They stated that the patient has an option of buying Famotidine 20mg  OTC and taking it BID QD to equal 40mg  or go through his insurance. If he goes thru , they stated that the appeal has to be done by the member/patient (we cannot do the appeal) and that he would need to call the Customer Service number on the back of his card

## 2019-10-10 NOTE — Telephone Encounter (Signed)
Also notified patient

## 2019-11-03 ENCOUNTER — Ambulatory Visit: Payer: Self-pay

## 2019-11-03 NOTE — Telephone Encounter (Signed)
Patient called stating that he has had chest and abdomin pressure. He states that he has felt SOB at times.  The symptoms have been ongoing since this am. He states that his body feels tingly all over but especially his left arm. He  Has been nauseated but has not vomited. He feels this is his heart or possible panic attack. He states he has no reason to feel panic No worries or concerns. He states that a few times he felt as though he would pass out.  Per protocol patient will be driven to ER for evaluation. Care advice was read to patient. He verbalized understanding. Reason for Disposition . Dizziness or lightheadedness  Answer Assessment - Initial Assessment Questions 1. LOCATION: "Where does it hurt?"       Chest and abdomin 2. RADIATION: "Does the pain go anywhere else?" (e.g., into neck, jaw, arms, back)     Down left arm body feels tingly 3. ONSET: "When did the chest pain begin?" (Minutes, hours or days)      This AM stomach pressure 4. PATTERN "Does the pain come and go, or has it been constant since it started?"  "Does it get worse with exertion?"      Constant for 20 minutes 5. DURATION: "How long does it last" (e.g., seconds, minutes, hours)   20 minutes 6. SEVERITY: "How bad is the pain?"  (e.g., Scale 1-10; mild, moderate, or severe)    - MILD (1-3): doesn't interfere with normal activities     - MODERATE (4-7): interferes with normal activities or awakens from sleep    - SEVERE (8-10): excruciating pain, unable to do any normal activities      89 7. CARDIAC RISK FACTORS: "Do you have any history of heart problems or risk factors for heart disease?" (e.g., angina, prior heart attack; diabetes, high blood pressure, high cholesterol, smoker, or strong family history of heart disease)     None grandfather had heart Dx 8. PULMONARY RISK FACTORS: "Do you have any history of lung disease?"  (e.g., blood clots in lung, asthma, emphysema, birth control pills)     Asthma as child 9.  CAUSE: "What do you think is causing the chest pain?"    Could be panic attack 10. OTHER SYMPTOMS: "Do you have any other symptoms?" (e.g., dizziness, nausea, vomiting, sweating, fever, difficulty breathing, cough)     Dizzy lightheaded, nauseated, sob at times 11. PREGNANCY: "Is there any chance you are pregnant?" "When was your last menstrual period?"       N/A  Protocols used: CHEST PAIN-A-AH

## 2020-02-13 ENCOUNTER — Ambulatory Visit: Payer: Self-pay | Admitting: *Deleted

## 2020-02-13 NOTE — Telephone Encounter (Signed)
Pt called in c/o being dizzy starting yesterday.  He has had this happen before.  I asked if he had a PCP and he does.   The protocol was to be seen within 3 days.   I encouraged him to call his PCP and set up an appt since this has happened more than once.  Or to go to the urgent care.  See triage notes.  He was agreeable to this.   I let him know to go to the ED or urgent care if he becomes worse or passes out.    Reason for Disposition . [1] MILD dizziness (e.g., walking normally) AND [2] has NOT been evaluated by physician for this  (Exception: dizziness caused by heat exposure, sudden standing, or poor fluid intake)  Answer Assessment - Initial Assessment Questions 1. DESCRIPTION: "Describe your dizziness."     I'm off balance when I'm standing around.  I've not talked with my doctor about it.    2. LIGHTHEADED: "Do you feel lightheaded?" (e.g., somewhat faint, woozy, weak upon standing)     It started yesterday morning. 3. VERTIGO: "Do you feel like either you or the room is spinning or tilting?" (i.e. vertigo)     See above 4. SEVERITY: "How bad is it?"  "Do you feel like you are going to faint?" "Can you stand and walk?"   - MILD: Feels slightly dizzy, but walking normally.   - MODERATE: Feels very unsteady when walking, but not falling; interferes with normal activities (e.g., school, work) .   - SEVERE: Unable to walk without falling, or requires assistance to walk without falling; feels like passing out now.      Yesterday I had to hold onto things to walk around.  When I'm moving around it stops but it happens when I lay down. 5. ONSET:  "When did the dizziness begin?"     Yesterday it started after doing physical activity. 6. AGGRAVATING FACTORS: "Does anything make it worse?" (e.g., standing, change in head position)     Laying down 7. HEART RATE: "Can you tell me your heart rate?" "How many beats in 15 seconds?"  (Note: not all patients can do this)       Not asked   No  history of heart problems. 8. CAUSE: "What do you think is causing the dizziness?"     It usually happens after something physical on occasion. 9. RECURRENT SYMPTOM: "Have you had dizziness before?" If Yes, ask: "When was the last time?" "What happened that time?"     Yes  Occasionally in the past.   Never talked with my doctor about this dizziness. 10. OTHER SYMPTOMS: "Do you have any other symptoms?" (e.g., fever, chest pain, vomiting, diarrhea, bleeding)       Yesterday I felt like I needed to vomit but today not as bad.   When I woke up this morning I was dizzy and a little nauseated but the nausea is gone. 11. PREGNANCY: "Is there any chance you are pregnant?" "When was your last menstrual period?"       N/A  Protocols used: DIZZINESS [facility]

## 2020-10-08 ENCOUNTER — Other Ambulatory Visit: Payer: Self-pay

## 2020-10-08 ENCOUNTER — Ambulatory Visit: Payer: Self-pay | Admitting: Physician Assistant

## 2020-10-08 VITALS — BP 123/73 | HR 77 | Temp 98.7°F | Resp 18 | Ht 69.0 in | Wt 168.0 lb

## 2020-10-08 DIAGNOSIS — Z1159 Encounter for screening for other viral diseases: Secondary | ICD-10-CM

## 2020-10-08 DIAGNOSIS — K219 Gastro-esophageal reflux disease without esophagitis: Secondary | ICD-10-CM

## 2020-10-08 DIAGNOSIS — F1010 Alcohol abuse, uncomplicated: Secondary | ICD-10-CM

## 2020-10-08 DIAGNOSIS — Z6824 Body mass index (BMI) 24.0-24.9, adult: Secondary | ICD-10-CM

## 2020-10-08 DIAGNOSIS — E559 Vitamin D deficiency, unspecified: Secondary | ICD-10-CM

## 2020-10-08 DIAGNOSIS — F5104 Psychophysiologic insomnia: Secondary | ICD-10-CM

## 2020-10-08 DIAGNOSIS — Z114 Encounter for screening for human immunodeficiency virus [HIV]: Secondary | ICD-10-CM

## 2020-10-08 MED ORDER — ESOMEPRAZOLE MAGNESIUM 20 MG PO CPDR: 20.0000 mg | DELAYED_RELEASE_CAPSULE | Freq: Every day | ORAL | 1 refills | Status: AC

## 2020-10-08 NOTE — Progress Notes (Signed)
New Patient Office Visit  Subjective:  Patient ID: Jesus Taylor, male    DOB: [birth year]  Age: 30 y.o. MRN: [id number]  CC:  Chief Complaint  Patient presents with   Insomnia     HPI Jesus Taylor reports that he is currently being treated for substance abuse at [facility] residential treatment center.  Reports that he has been at [facility] since the end of July, states that he will be returning to his home in [place] after he completes treatment.  Reports that he has been having difficulty sleeping despite taking 5 mg of melatonin.  Reports that he was waking up at approximately 2 AM and having difficulty falling back asleep.  Reports the last 2 nights he did not use melatonin and was able to sleep well throughout the night.  Reports that he does suffer from GERD, states that it started a few years ago, reports that it is well controlled with Nexium and lifestyle modifications.   History reviewed. No pertinent past medical history.  History reviewed. No pertinent surgical history.  History reviewed. No pertinent family history.  Social History   Socioeconomic History   Marital status: Single    Spouse name: Not on file   Number of children: Not on file   Years of education: Not on file   Highest education level: Not on file  Occupational History   Not on file  Tobacco Use   Smoking status: Never   Smokeless tobacco: Never  Substance and Sexual Activity   Alcohol use: Yes    Comment: weekly   Drug use: No   Sexual activity: Not on file  Other Topics Concern   Not on file  Social History Narrative   Not on file   Social Determinants of Health   Financial Resource Strain: Not on file  Food Insecurity: Not on file  Transportation Needs: Not on file  Physical Activity: Not on file  Stress: Not on file  Social Connections: Not on file  Intimate Partner Violence: Not on file    ROS Review of Systems  Constitutional: Negative.   HENT: Negative.     Eyes: Negative.   Respiratory:  Negative for shortness of breath.   Cardiovascular:  Negative for chest pain.  Gastrointestinal:  Negative for abdominal pain.  Endocrine: Negative.   Genitourinary: Negative.   Musculoskeletal: Negative.   Skin: Negative.   Allergic/Immunologic: Negative.   Neurological: Negative.   Hematological: Negative.   Psychiatric/Behavioral:  Positive for sleep disturbance. Negative for dysphoric mood, self-injury and suicidal ideas. The patient is not nervous/anxious.    Objective:   Today's Vitals: BP 123/73 (BP Location: Left Arm, Patient Position: Sitting, Cuff Size: Normal)   Pulse 77   Temp 98.7 F (37.1 C) (Oral)   Resp 18   Ht 5\' 9"  (1.753 m)   Wt 168 lb (76.2 kg)   SpO2 100%   BMI 24.81 kg/m   Physical Exam Vitals and nursing note reviewed.  Constitutional:      Appearance: Normal appearance.  HENT:     Head: Normocephalic and atraumatic.     Right Ear: External ear normal.     Left Ear: External ear normal.     Nose: Nose normal.     Mouth/Throat:     Mouth: Mucous membranes are moist.     Pharynx: Oropharynx is clear.  Eyes:     Extraocular Movements: Extraocular movements intact.     Conjunctiva/sclera: Conjunctivae normal.     Pupils:  Pupils are equal, round, and reactive to light.  Cardiovascular:     Rate and Rhythm: Normal rate.     Pulses: Normal pulses.     Heart sounds: Normal heart sounds.  Pulmonary:     Effort: Pulmonary effort is normal.     Breath sounds: Normal breath sounds.  Musculoskeletal:        General: Normal range of motion.     Cervical back: Normal range of motion and neck supple.  Skin:    General: Skin is warm and dry.  Neurological:     General: No focal deficit present.     Mental Status: He is alert and oriented to person, place, and time.  Psychiatric:        Mood and Affect: Mood normal.        Behavior: Behavior normal.        Thought Content: Thought content normal.        Judgment:  Judgment normal.    Assessment & Plan:   Problem List Items Addressed This Visit       Digestive   Gastroesophageal reflux disease without esophagitis   Relevant Medications   esomeprazole (NEXIUM 24HR) 20 MG capsule     Other   Psychophysiological insomnia - Primary   Relevant Orders   Vitamin D, 25-hydroxy (Completed)   TSH (Completed)   Other Visit Diagnoses     Screening for HIV (human immunodeficiency virus)       Relevant Orders   HIV antibody (with reflex) (Completed)   Encounter for HCV screening test for low risk patient       Relevant Orders   HCV Ab w Reflex to Quant PCR (Completed)   BMI 24.0-24.9, adult           Outpatient Encounter Medications as of 10/08/2020  Medication Sig   esomeprazole (NEXIUM 24HR) 20 MG capsule Take 1 capsule (20 mg total) by mouth daily at 12 noon.   famotidine (PEPCID) 40 MG tablet Take 1 tablet (40 mg total) by mouth every evening.   sucralfate (CARAFATE) 1 g tablet Take 1 tablet (1 g total) by mouth 4 (four) times daily.   No facility-administered encounter medications on file as of 10/08/2020.   1. Psychophysiological insomnia Patient education given on good sleep hygiene, continue melatonin or Benadryl as needed Patient encouraged to return to mobile unit to discuss [facility] financial assistance and establish with a primary care provider after he has completed his program.  Patient understands and agrees - Vitamin D, 25-hydroxy - TSH  2. Gastroesophageal reflux disease without esophagitis Continue current regimen, patient education given on lifestyle modifications - esomeprazole (NEXIUM 24HR) 20 MG capsule; Take 1 capsule (20 mg total) by mouth daily at 12 noon.  Dispense: 30 capsule; Refill: 1  3. Screening for HIV (human immunodeficiency virus)  - HIV antibody (with reflex)  4. Encounter for HCV screening test for low risk patient  - HCV Ab w Reflex to Quant PCR  5. BMI 24.0-24.9, adult    I have reviewed  the patient's medical history (PMH, PSH, Social History, Family History, Medications, and allergies) , and have been updated if relevant. I spent 30 minutes reviewing chart and  face to face time with patient.    Follow-up: Return if symptoms worsen or fail to improve.   Kasandra Knudsen Mayers, PA-C

## 2020-10-08 NOTE — Progress Notes (Signed)
Patient has eaten today. Patient has not taken any medication today. Patient repots trouble sleeping since he was a child with melatonin increasing the concern. Patient denies pain at this time.

## 2020-10-08 NOTE — Patient Instructions (Addendum)
To help with your heartburn and acid reflux, you will take Nexium on a daily basis.  To help with your insomnia, I do encourage you to make sure that you have a good sleep routine, and use melatonin or Benadryl as needed.  We will call you with today's lab results.  Please make sure to follow-up with the mobile unit after you have returned home.  You can find our location by going to [email or web address] and searching for mobile medicine.  Roney Jaffeari S. Mayers, PA-C Physician Assistant [facility] [email or web address]   Food Choices for Gastroesophageal Reflux Disease, Adult When you have gastroesophageal reflux disease (GERD), the foods you eat and your eating habits are very important. Choosing the right foods can help ease the discomfort of GERD. Consider working with a dietitian to help you [employer] food choices. What are tips for following this plan? Reading food labels Look for foods that are low in saturated fat. Foods that have less than 5% of daily value (DV) of fat and 0 g of trans fats may help with your symptoms. Cooking Cook foods using methods other than frying. This may include baking, steaming, grilling, or broiling. These are all methods that do not need a lot of fat for cooking. To add flavor, try to use herbs that are low in spice and acidity. Meal planning  Choose healthy foods that are low in fat, such as fruits, vegetables, whole grains, low-fat dairy products, lean meats, fish, and poultry. Eat frequent, small meals instead of three large meals each day. Eat your meals slowly, in a relaxed setting. Avoid bending over or lying down until 2-3 hours after eating. Limit high-fat foods such as fatty meats or fried foods. Limit your intake of fatty foods, such as oils, butter, and shortening. Avoid the following as told by your health care provider: Foods that cause symptoms. These may be different for different people. Keep a  food diary to keep track of foods that cause symptoms. Alcohol. Drinking large amounts of liquid with meals. Eating meals during the 2-3 hours before bed.  Lifestyle Maintain a healthy weight. Ask your health care provider what weight is healthy for you. If you need to lose weight, work with your health care provider to do so safely. Exercise for at least 30 minutes on 5 or more days each week, or as told by your health care provider. Avoid wearing clothes that fit tightly around your waist and chest. Do not use any products that contain nicotine or tobacco. These products include cigarettes, chewing tobacco, and vaping devices, such as e-cigarettes. If you need help quitting, ask your health care provider. Sleep with the head of your bed raised. Use a wedge under the mattress or blocks under the bed frame to raise the head of the bed. Chew sugar-free gum after mealtimes. What foods should I eat?  Eat a healthy, well-balanced diet of fruits, vegetables, whole grains, low-fat dairy products, lean meats, fish, and poultry. Each person is different. Foods that may trigger symptoms in one person may not trigger any symptoms in another person. Work with your health care provider to identify foods that are safe foryou. The items listed above may not be a complete list of recommended foods and beverages. Contact a dietitian for more information. What foods should I avoid? Limiting some of these foods may help manage the symptoms of GERD. Everyone is different. Consult a dietitian or your health care provider to help youidentify the exact foods to avoid,  if any. Fruits Any fruits prepared with added fat. Any fruits that cause symptoms. For some people this may include citrus fruits, such as oranges, grapefruit, pineapple,and lemons. Vegetables Deep-fried vegetables. [place] fries. Any vegetables prepared with added fat. Any vegetables that cause symptoms. For some people, this may include tomatoesand  tomato products, chili peppers, onions and garlic, and horseradish. Grains Pastries or quick breads with added fat. Meats and other proteins High-fat meats, such as fatty beef or pork, hot dogs, ribs, ham, sausage, salami, and bacon. Fried meat or protein, including fried fish and friedchicken. Nuts and nut butters, in large amounts. Dairy Whole milk and chocolate milk. Sour cream. Cream. Ice cream. Cream cheese.Milkshakes. Fats and oils Butter. Margarine. Shortening. Ghee. Beverages Coffee and tea, with or without caffeine. Carbonated beverages. Sodas. Energy drinks. Fruit juice made with acidic fruits, such as orange or grapefruit.Tomato juice. Alcoholic drinks. Sweets and desserts Chocolate and cocoa. Donuts. Seasonings and condiments Pepper. Peppermint and spearmint. Added salt. Any condiments, herbs, or seasonings that cause symptoms. For some people, this may include curry, hotsauce, or vinegar-based salad dressings. The items listed above may not be a complete list of foods and beverages to avoid. Contact a dietitian for more information. Questions to ask your health care provider Diet and lifestyle changes are usually the first steps that are taken to manage symptoms of GERD. If diet and lifestyle changes do not improve your symptoms,talk with your health care provider about taking medicines. Where to find more information International Foundation for Gastrointestinal Disorders: aboutgerd.org Summary When you have gastroesophageal reflux disease (GERD), food and lifestyle choices may be very helpful in easing the discomfort of GERD. Eat frequent, small meals instead of three large meals each day. Eat your meals slowly, in a relaxed setting. Avoid bending over or lying down until 2-3 hours after eating. Limit high-fat foods such as fatty meats or fried foods. This information is not intended to replace advice given to you by your health care provider. Make sure you discuss any  questions you have with your healthcare provider. Document Revised: 08/22/2019 Document Reviewed: 08/22/2019 Elsevier Patient Education  2022 Elsevier Inc.  Insomnia Insomnia is a sleep disorder that makes it difficult to fall asleep or stay asleep. Insomnia can cause fatigue, low energy, difficulty concentrating, moodswings, and poor performance at work or school. There are three different ways to classify insomnia: Difficulty falling asleep. Difficulty staying asleep. Waking up too early in the morning. Any type of insomnia can be long-term (chronic) or short-term (acute). Both are common. Short-term insomnia usually lasts for three months or less. Chronic insomnia occurs at least three times a week for longer than threemonths. What are the causes? Insomnia may be caused by another condition, situation, or substance, such as: Anxiety. Certain medicines. Gastroesophageal reflux disease (GERD) or other gastrointestinal conditions. Asthma or other breathing conditions. Restless legs syndrome, sleep apnea, or other sleep disorders. Chronic pain. Menopause. Stroke. Abuse of alcohol, tobacco, or illegal drugs. Mental health conditions, such as depression. Caffeine. Neurological disorders, such as Alzheimer's disease. An overactive thyroid (hyperthyroidism). Sometimes, the cause of insomnia may not be known. What increases the risk? Risk factors for insomnia include: Gender. Women are affected more often than men. Age. Insomnia is more common as you get older. Stress. Lack of exercise. Irregular work schedule or working night shifts. Traveling between different time zones. Certain medical and mental health conditions. What are the signs or symptoms? If you have insomnia, the main symptom is having trouble falling  asleep or having trouble staying asleep. This may lead to other symptoms, such as: Feeling fatigued or having low energy. Feeling nervous about going to sleep. Not  feeling rested in the morning. Having trouble concentrating. Feeling irritable, anxious, or depressed. How is this diagnosed? This condition may be diagnosed based on: Your symptoms and medical history. Your health care provider may ask about: Your sleep habits. Any medical conditions you have. Your mental health. A physical exam. How is this treated? Treatment for insomnia depends on the cause. Treatment may focus on treating an underlying condition that is causing insomnia. Treatment may also include: Medicines to help you sleep. Counseling or therapy. Lifestyle adjustments to help you sleep better. Follow these instructions at home: Eating and drinking  Limit or avoid alcohol, caffeinated beverages, and cigarettes, especially close to bedtime. These can disrupt your sleep. Do not eat a large meal or eat spicy foods right before bedtime. This can lead to digestive discomfort that can make it hard for you to sleep.  Sleep habits  Keep a sleep diary to help you and your health care provider figure out what could be causing your insomnia. Write down: When you sleep. When you wake up during the night. How well you sleep. How rested you feel the next day. Any side effects of medicines you are taking. What you eat and drink. Make your bedroom a dark, comfortable place where it is easy to fall asleep. Put up shades or blackout curtains to block light from outside. Use a white noise machine to block noise. Keep the temperature cool. Limit screen use before bedtime. This includes: Watching TV. Using your smartphone, tablet, or computer. Stick to a routine that includes going to bed and waking up at the same times every day and night. This can help you fall asleep faster. Consider making a quiet activity, such as reading, part of your nighttime routine. Try to avoid taking naps during the day so that you sleep better at night. Get out of bed if you are still awake after 15 minutes of  trying to sleep. Keep the lights down, but try reading or doing a quiet activity. When you feel sleepy, go back to bed.  General instructions Take over-the-counter and prescription medicines only as told by your health care provider. Exercise regularly, as told by your health care provider. Avoid exercise starting several hours before bedtime. Use relaxation techniques to manage stress. Ask your health care provider to suggest some techniques that may work well for you. These may include: Breathing exercises. Routines to release muscle tension. Visualizing peaceful scenes. Make sure that you drive carefully. Avoid driving if you feel very sleepy. Keep all follow-up visits as told by your health care provider. This is important. Contact a health care provider if: You are tired throughout the day. You have trouble in your daily routine due to sleepiness. You continue to have sleep problems, or your sleep problems get worse. Get help right away if: You have serious thoughts about hurting yourself or someone else. If you ever feel like you may hurt yourself or others, or have thoughts about taking your own life, get help right away. You can go to your nearest emergency department or call: Your local emergency services (911 in the [place] A suicide crisis helpline, such as the National Suicide Prevention Lifeline at 818 339 2599. This is open 24 hours a day. Summary Insomnia is a sleep disorder that makes it difficult to fall asleep or stay asleep. Insomnia can be  long-term (chronic) or short-term (acute). Treatment for insomnia depends on the cause. Treatment may focus on treating an underlying condition that is causing insomnia. Keep a sleep diary to help you and your health care provider figure out what could be causing your insomnia. This information is not intended to replace advice given to you by your health care provider. Make sure you discuss any questions you have with your healthcare  provider. Document Revised: 12/22/2019 Document Reviewed: 12/22/2019 Elsevier Patient Education  2022 [employer].

## 2020-10-09 DIAGNOSIS — E559 Vitamin D deficiency, unspecified: Secondary | ICD-10-CM | POA: Insufficient documentation

## 2020-10-09 DIAGNOSIS — F5104 Psychophysiologic insomnia: Secondary | ICD-10-CM | POA: Insufficient documentation

## 2020-10-09 DIAGNOSIS — K219 Gastro-esophageal reflux disease without esophagitis: Secondary | ICD-10-CM | POA: Insufficient documentation

## 2020-10-09 LAB — HCV AB W REFLEX TO QUANT PCR: HCV Ab: <0.1 s/co ratio (ref 0.0–0.9)

## 2020-10-09 LAB — HIV ANTIBODY (ROUTINE TESTING W REFLEX): HIV Screen 4th Generation wRfx: NONREACTIVE

## 2020-10-09 LAB — HCV INTERPRETATION

## 2020-10-09 LAB — VITAMIN D 25 HYDROXY (VIT D DEFICIENCY, FRACTURES): Vit D, 25-Hydroxy: 14.5 ng/mL — ABNORMAL LOW (ref 30.0–100.0)

## 2020-10-09 LAB — TSH: TSH: 0.885 u[IU]/mL (ref 0.450–4.500)

## 2020-10-09 MED ORDER — VITAMIN D (ERGOCALCIFEROL) 1.25 MG (50000 UNIT) PO CAPS: 50000.0000 [IU] | ORAL_CAPSULE | ORAL | 2 refills | Status: AC

## 2020-10-09 NOTE — Addendum Note (Signed)
Addended by: Roney Jaffe on: 10/09/2020 02:02 PM   Modules accepted: Orders

## 2020-10-11 ENCOUNTER — Telehealth: Payer: Self-pay | Admitting: *Deleted

## 2020-10-11 NOTE — Telephone Encounter (Signed)
-----   Message from Roney Jaffe, [place] sent at 10/09/2020  2:02 PM EDT ----- Please call patient and let him know that his thyroid function is within normal limits, his screening for hepatitis C and HIV were negative.  His vitamin D is low.  He needs to take 50,000 units once a week for the next 12 weeks and have it rechecked at that time.  Prescription sent to his pharmacy.

## 2020-10-11 NOTE — Telephone Encounter (Signed)
RN is aware of patients results and has began patients medications and aware of planning FU visit. 

## 2020-11-07 ENCOUNTER — Ambulatory Visit (HOSPITAL_COMMUNITY): Payer: Self-pay | Admitting: Licensed Clinical Social Worker

## 2021-01-14 ENCOUNTER — Ambulatory Visit (HOSPITAL_COMMUNITY): Payer: 59 | Admitting: Licensed Clinical Social Worker

## 2021-06-09 IMAGING — US US ABDOMEN LIMITED
1 series · 14 of 25 positions shown · non-contrast
Comparison: None.

CLINICAL DATA: Elevated liver enzymes

EXAM:
ULTRASOUND ABDOMEN LIMITED RIGHT UPPER QUADRANT

[Series 1: us abdomen limited · 0.17mm/px · 14 of 42 slices shown]
[im 1/42]
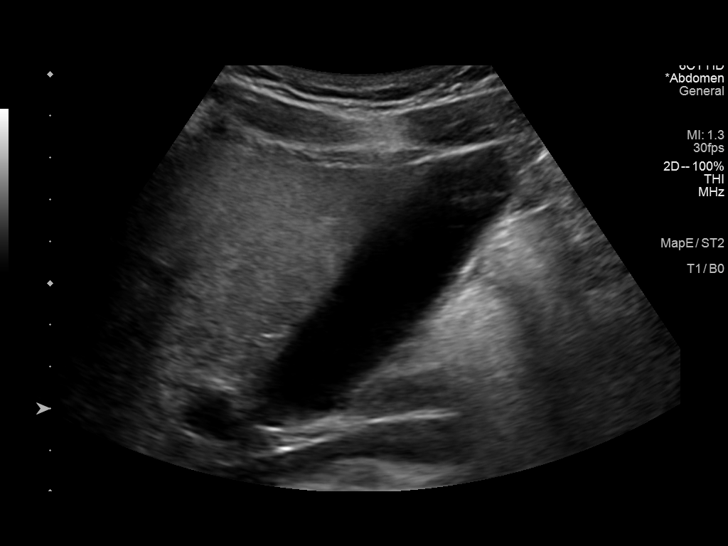
[im 4/42]
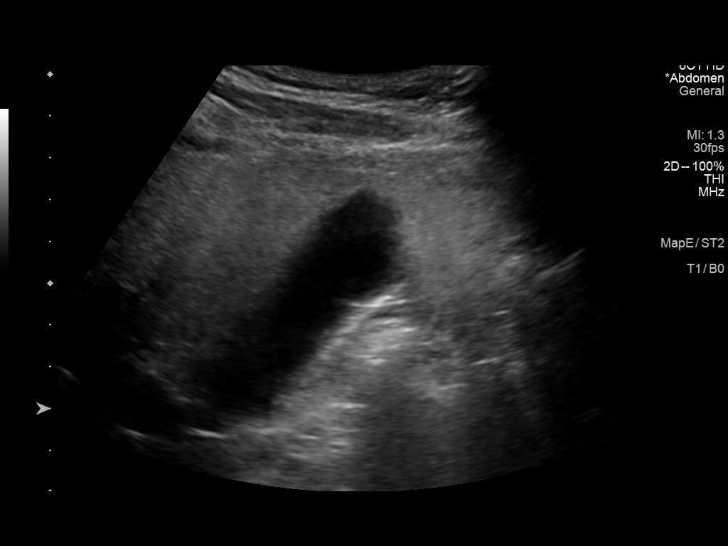
[im 7/42]
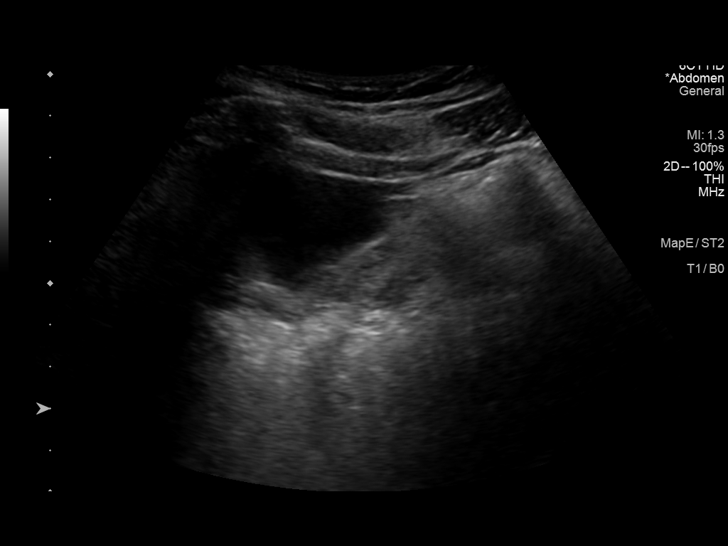
[im 11/42]
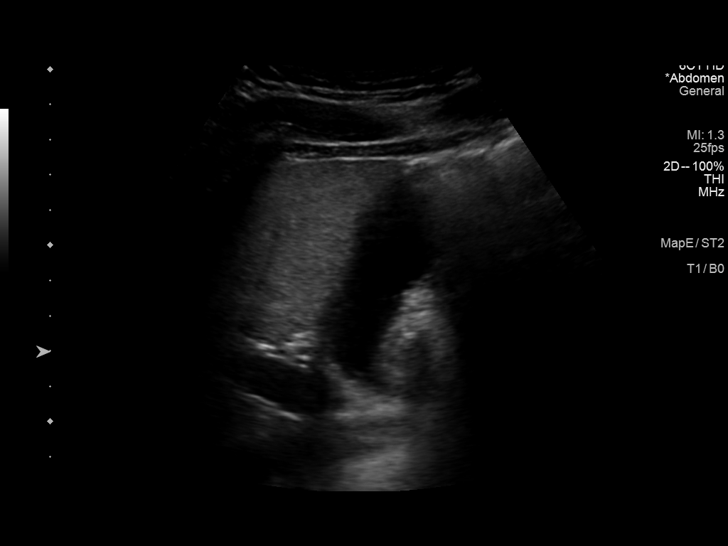
[im 14/42]
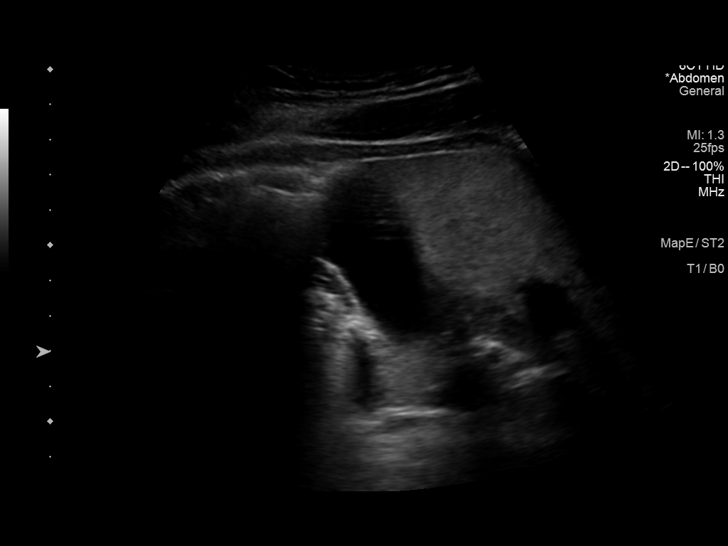
[im 16/42]
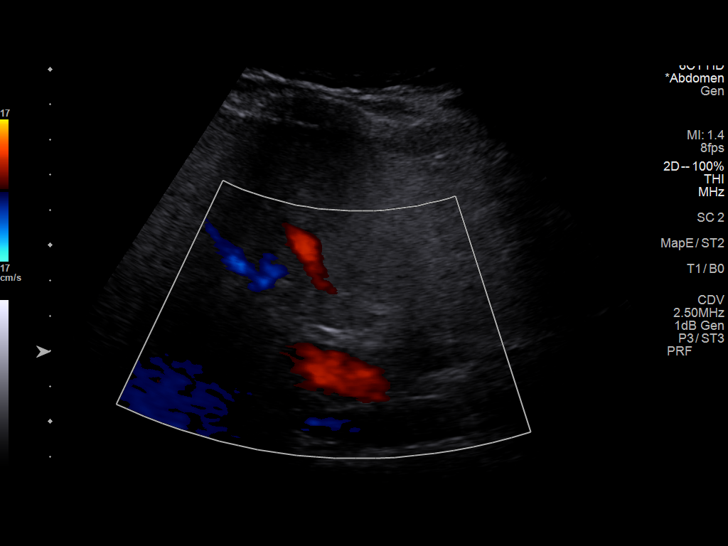
[im 19/42]
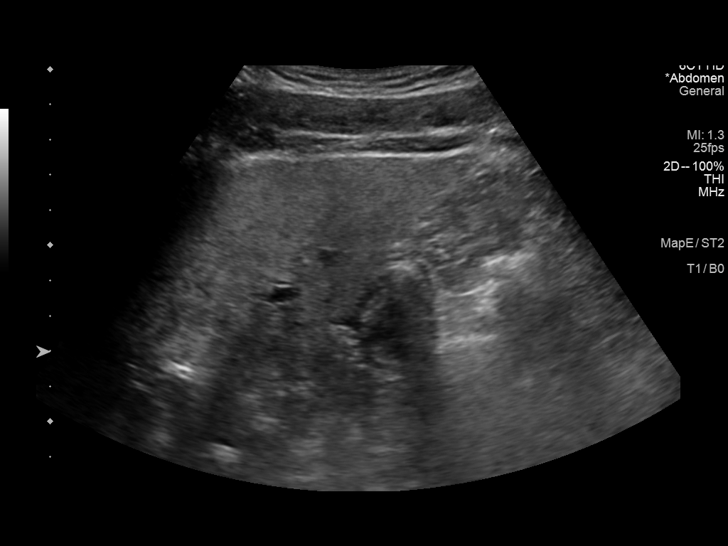
[im 23/42]
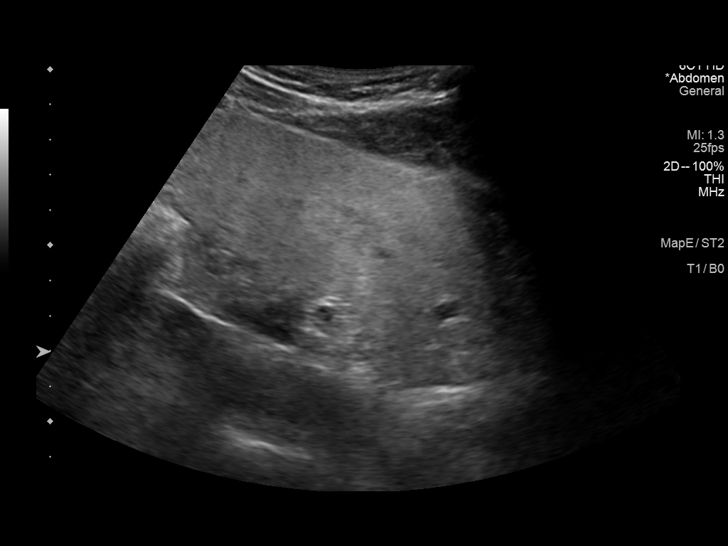
[im 26/42]
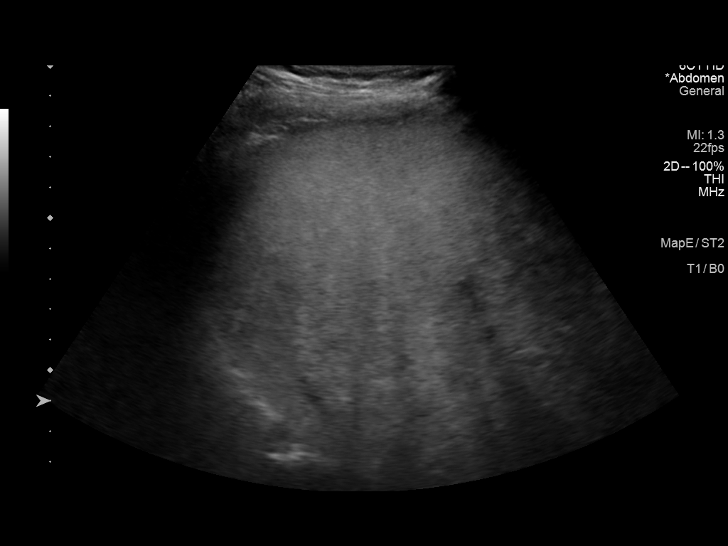
[im 28/42]
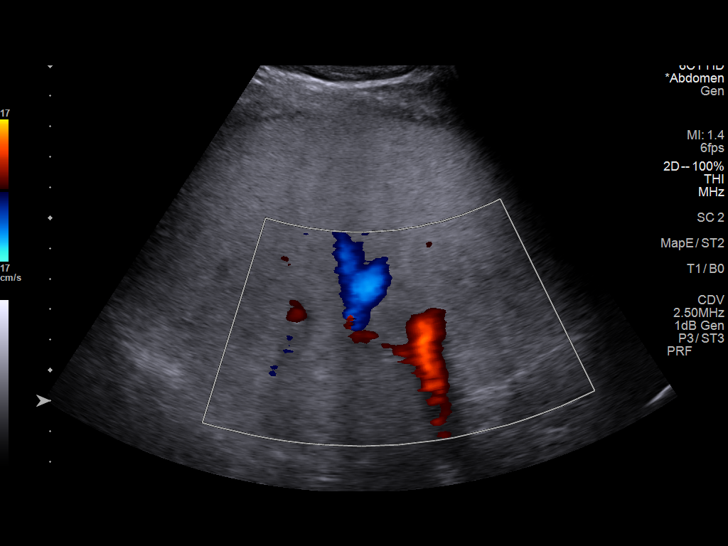
[im 31/42]
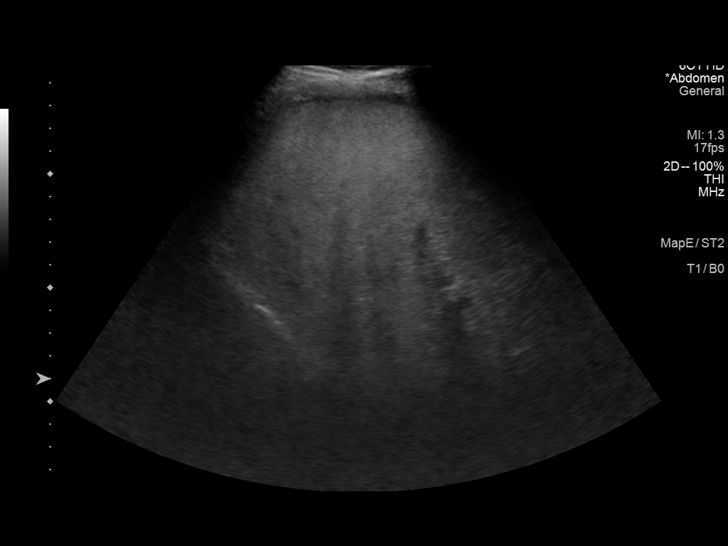
[im 35/42]
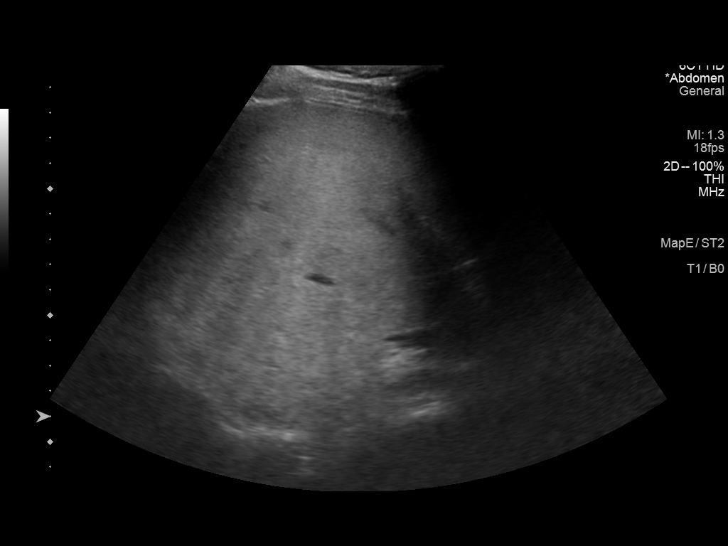
[im 38/42]
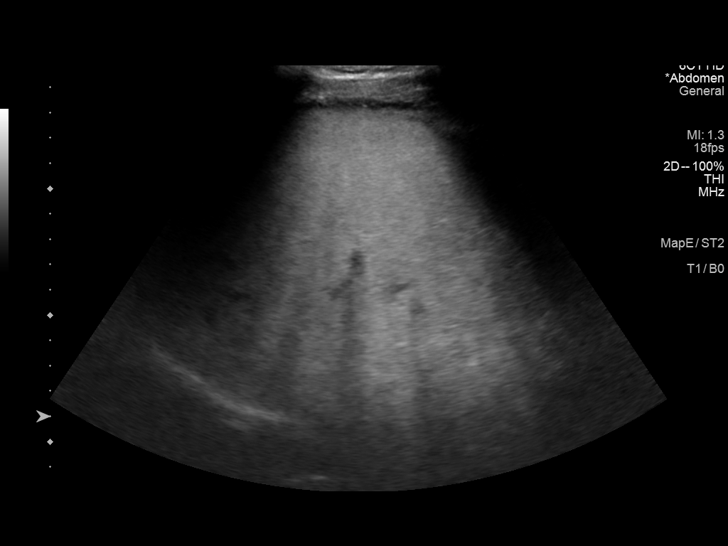
[im 42/42]
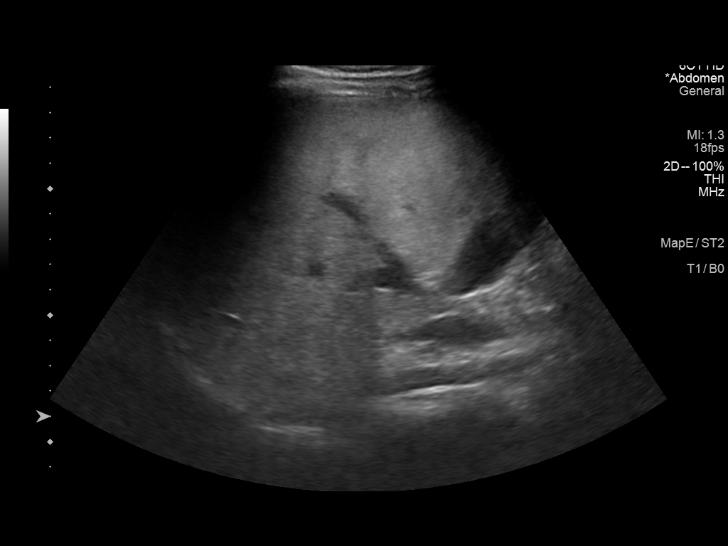

[14 of 25 positions shown; findings below may reference images not displayed]

FINDINGS: Gallbladder:

No gallstones or wall thickening visualized. There is no
pericholecystic fluid. No sonographic Murphy sign noted by
sonographer.

Common bile duct:

Diameter: 3 mm. No intrahepatic or extrahepatic biliary duct
dilatation.

Liver:

No focal lesion identified. Liver echogenicity overall is increased.
Portal vein is patent on color Doppler imaging with normal direction
of blood flow towards the liver.

Other: None.
IMPRESSION: Liver echogenicity overall is increased, a finding indicative of
hepatic steatosis. No focal liver lesions evident on this study.

Study otherwise unremarkable.

## 2021-11-15 ENCOUNTER — Other Ambulatory Visit: Payer: Self-pay

## 2021-11-15 ENCOUNTER — Emergency Department (HOSPITAL_COMMUNITY)
Admission: EM | Admit: 2021-11-15 | Discharge: 2021-11-15 | Disposition: A | Discharge status: Home / Self Care | Payer: Commercial Managed Care - HMO | Attending: Emergency Medicine | Admitting: Emergency Medicine

## 2021-11-15 ENCOUNTER — Ambulatory Visit
Admission: EM | Admit: 2021-11-15 | Discharge: 2021-11-15 | Disposition: A | Discharge status: Home / Self Care | Payer: Commercial Managed Care - HMO

## 2021-11-15 ENCOUNTER — Encounter (HOSPITAL_COMMUNITY): Payer: Self-pay

## 2021-11-15 DIAGNOSIS — R252 Cramp and spasm: Secondary | ICD-10-CM | POA: Insufficient documentation

## 2021-11-15 DIAGNOSIS — M62441 Contracture of muscle, right hand: Secondary | ICD-10-CM

## 2021-11-15 DIAGNOSIS — E876 Hypokalemia: Secondary | ICD-10-CM | POA: Insufficient documentation

## 2021-11-15 DIAGNOSIS — M62442 Contracture of muscle, left hand: Secondary | ICD-10-CM

## 2021-11-15 LAB — CBC WITH DIFFERENTIAL/PLATELET
Abs Immature Granulocytes: 0.05 10*3/uL (ref 0.00–0.07)
Basophils Absolute: 0.1 10*3/uL (ref 0.0–0.1)
Basophils Relative: 1 %
Eosinophils Absolute: 0.1 10*3/uL (ref 0.0–0.5)
Eosinophils Relative: 1 %
HCT: 43.4 % (ref 39.0–52.0)
Hemoglobin: 14.5 g/dL (ref 13.0–17.0)
Immature Granulocytes: 0 %
Lymphocytes Relative: 15 %
Lymphs Abs: 1.7 10*3/uL (ref 0.7–4.0)
MCH: 28.3 pg (ref 26.0–34.0)
MCHC: 33.4 g/dL (ref 30.0–36.0)
MCV: 84.8 fL (ref 80.0–100.0)
Monocytes Absolute: 0.9 10*3/uL (ref 0.1–1.0)
Monocytes Relative: 8 %
Neutro Abs: 8.4 10*3/uL — ABNORMAL HIGH (ref 1.7–7.7)
Neutrophils Relative %: 75 %
Platelets: 326 10*3/uL (ref 150–400)
RBC: 5.12 MIL/uL (ref 4.22–5.81)
RDW: 13.7 % (ref 11.5–15.5)
WBC: 11.2 10*3/uL — ABNORMAL HIGH (ref 4.0–10.5)
nRBC: 0 % (ref 0.0–0.2)

## 2021-11-15 LAB — COMPREHENSIVE METABOLIC PANEL
ALT: 30 U/L (ref 0–44)
AST: 32 U/L (ref 15–41)
Albumin: 4.7 g/dL (ref 3.5–5.0)
Alkaline Phosphatase: 50 U/L (ref 38–126)
Anion gap: 12 (ref 5–15)
BUN: 5 mg/dL — ABNORMAL LOW (ref 6–20)
CO2: 22 mmol/L (ref 22–32)
Calcium: 8.9 mg/dL (ref 8.9–10.3)
Chloride: 107 mmol/L (ref 98–111)
Creatinine, Ser: 0.96 mg/dL (ref 0.61–1.24)
GFR, Estimated: >60 mL/min (ref >60)
Glucose, Bld: 85 mg/dL (ref 70–99)
Potassium: 3.1 mmol/L — ABNORMAL LOW (ref 3.5–5.1)
Sodium: 141 mmol/L (ref 135–145)
Total Bilirubin: 0.8 mg/dL (ref 0.3–1.2)
Total Protein: 7.6 g/dL (ref 6.5–8.1)

## 2021-11-15 LAB — MAGNESIUM: Magnesium: 1.3 mg/dL — ABNORMAL LOW (ref 1.7–2.4)

## 2021-11-15 MED ORDER — POTASSIUM CHLORIDE CRYS ER 20 MEQ PO TBCR
40.0000 meq | EXTENDED_RELEASE_TABLET | Freq: Once | ORAL | Status: AC
  Administered 2021-11-15: 40 meq via ORAL
  Filled 2021-11-15: qty 2

## 2021-11-15 MED ORDER — POTASSIUM CHLORIDE ER 10 MEQ PO TBCR: 20.0000 meq | EXTENDED_RELEASE_TABLET | Freq: Every day | ORAL | 0 refills | Status: AC

## 2021-11-15 MED ORDER — MAGNESIUM 30 MG PO TABS: 30.0000 mg | ORAL_TABLET | Freq: Two times a day (BID) | ORAL | 0 refills | Status: AC

## 2021-11-15 MED ORDER — LACTATED RINGERS IV BOLUS
1000.0000 mL | Freq: Once | INTRAVENOUS | Status: AC
Start: 2021-11-15 — End: 2021-11-15
  Administered 2021-11-15: 1000 mL via INTRAVENOUS

## 2021-11-15 MED ORDER — MAGNESIUM SULFATE 2 GM/50ML IV SOLN
2.0000 g | Freq: Once | INTRAVENOUS | Status: AC
  Administered 2021-11-15: 2 g via INTRAVENOUS
  Filled 2021-11-15: qty 50

## 2021-11-15 MED ORDER — CYCLOBENZAPRINE HCL 10 MG PO TABS: 10.0000 mg | ORAL_TABLET | Freq: Two times a day (BID) | ORAL | 0 refills | Status: AC | PRN

## 2021-11-15 MED ORDER — CYCLOBENZAPRINE HCL 10 MG PO TABS
10.0000 mg | ORAL_TABLET | Freq: Once | ORAL | Status: AC
  Administered 2021-11-15: 10 mg via ORAL
  Filled 2021-11-15: qty 1

## 2021-11-15 NOTE — ED Notes (Signed)
Patient verbalizes understanding of discharge instructions. Opportunity for questioning and answers were provided. Armband removed by staff, pt discharged from ED.  

## 2021-11-15 NOTE — ED Notes (Signed)
Patient is being discharged from the Urgent Care and sent to the Emergency Department via pov . Per mound np, patient is in need of higher level of care due to need for lab work. Patient is aware and verbalizes understanding of plan of care.  Vitals:   11/15/21 1229  BP: 139/79  Pulse: (!) 112  Resp: 18  Temp: 98 F (36.7 C)  SpO2: 98%

## 2021-11-15 NOTE — Discharge Instructions (Signed)
Go to the emergency department as soon as you leave urgent care for further evaluation and management. 

## 2021-11-15 NOTE — Discharge Instructions (Signed)
Take magnesium medication for the next 4 days.  Take potassium supplementation for the next 2 days.  Use Flexeril as needed for muscle spasms.  Follow-up with your primary doctor in approximately 1 week for a lab recheck.  Return to the ED as needed for new or worsening symptoms.

## 2021-11-15 NOTE — ED Provider Triage Note (Signed)
Emergency Medicine Provider Triage Evaluation Note  Jesus Taylor , a 31 y.o. male  was evaluated in triage.  Pt complains of spasm to his bilateral hands prior to arrival.  Patient notes he was lifting heavy boxes of tiles prior to the onset of his symptoms.  Denies pain to the area.  Denies similar symptoms.  Was evaluated urgent care and told to come to emergency department further symptoms. Denies chest pain, shortness of breath, abdominal pain, vomiting.   Review of Systems  Positive:  Negative:   Physical Exam  BP (!) 131/95   Pulse 88   Temp 98.3 F (36.8 C) (Oral)   Resp 18   Ht 5\' 9"  (1.753 m)   Wt 76.7 kg   SpO2 100%   BMI 24.96 kg/m  Gen:   Awake, no distress   Resp:  Normal effort  MSK:   Moves extremities without difficulty  Other:  No spasm noted to BUE.  Patient able to open and close his bilateral hands without difficulty.  Grip strength 5/5 bilaterally.  Medical Decision Making  Medically screening exam initiated at 1:42 PM.  Appropriate orders placed.  MITSURU DAULT was informed that the remainder of the evaluation will be completed by another provider, this initial triage assessment does not replace that evaluation, and the importance of remaining in the ED until their evaluation is complete.  Work-up initiated   Arash Karstens A, PA-C 11/15/21 1344

## 2021-11-15 NOTE — ED Provider Notes (Signed)
[facility]    CSN: [id number] Arrival date & time: 11/15/21  1225      History   Chief Complaint Chief Complaint  Patient presents with   arm cramping    HPI Jesus Taylor is a 31 y.o. male.   Patient presents with bilateral hand cramping and contracture that started briefly prior to arrival to urgent care.  Patient reports that he was lifting heavy boxes of tiles before it started.  Denies any associated pain.  Denies any associated dizziness, chest pain, shortness of breath, headache, blurred vision, nausea, vomiting.  Patient denies that this has ever occurred before.     No past medical history on file.  Patient Active Problem List   Diagnosis Date Noted   Psychophysiological insomnia 10/09/2020   Gastroesophageal reflux disease without esophagitis 10/09/2020   Vitamin D deficiency 10/09/2020   Metatarsal stress fracture of left foot 06/19/2014    No past surgical history on file.     Home Medications    Prior to Admission medications   Medication Sig Start Date End Date Taking? Authorizing Provider  esomeprazole (NEXIUM 24HR) 20 MG capsule Take 1 capsule (20 mg total) by mouth daily at 12 noon. 10/08/20   Mayers, Cari S, PA-C  famotidine (PEPCID) 40 MG tablet Take 1 tablet (40 mg total) by mouth every evening. 08/31/19 08/30/20  Dara Hoyer, PA-C  sucralfate (CARAFATE) 1 g tablet Take 1 tablet (1 g total) by mouth 4 (four) times daily. 08/31/19 09/30/19  Dara Hoyer, PA-C  Vitamin D, Ergocalciferol, (DRISDOL) 1.25 MG (50000 UNIT) CAPS capsule Take 1 capsule (50,000 Units total) by mouth every 7 (seven) days. 10/09/20   Mayers, Loraine Grip, PA-C    Family History No family history on file.  Social History Social History   Tobacco Use   Smoking status: Never   Smokeless tobacco: Never  Substance Use Topics   Alcohol use: Yes    Comment: weekly   Drug use: No     Allergies   Strawberry extract   Review of Systems Review of  Systems Per HPI  Physical Exam Triage Vital Signs ED Triage Vitals [11/15/21 1229]  Enc Vitals Group     BP 139/79     Pulse Rate (!) 112     Resp 18     Temp 98 F (36.7 C)     Temp src      SpO2 98 %     Weight      Height      Head Circumference      Peak Flow      Pain Score      Pain Loc      Pain Edu?      Excl. in [place]?    No data found.  Updated Vital Signs BP 139/79   Pulse (!) 112   Temp 98 F (36.7 C)   Resp 18   SpO2 98%   Visual Acuity Right Eye Distance:   Left Eye Distance:   Bilateral Distance:    Right Eye Near:   Left Eye Near:    Bilateral Near:     Physical Exam Constitutional:      General: He is not in acute distress.    Appearance: Normal appearance. He is not toxic-appearing or diaphoretic.  HENT:     Head: Normocephalic and atraumatic.  Eyes:     Extraocular Movements: Extraocular movements intact.     Conjunctiva/sclera: Conjunctivae normal.  Pulmonary:  Effort: Pulmonary effort is normal.  Musculoskeletal:     Comments: Patient's hands and fingers are flexed.  Right index finger is extended.  Patient reports he cannot manually move his hands.  Able to manually extend and flex fingers and hands.  Patient does not have any grip strength.  No tenderness to palpation.  Capillary refill and pulses are normal.  No obvious swelling or discoloration noted.  Neurological:     General: No focal deficit present.     Mental Status: He is alert and oriented to person, place, and time. Mental status is at baseline.  Psychiatric:        Mood and Affect: Mood normal.        Behavior: Behavior normal.        Thought Content: Thought content normal.        Judgment: Judgment normal.      UC Treatments / Results  Labs (all labs ordered are listed, but only abnormal results are displayed) Labs Reviewed - No data to display  EKG   Radiology No results found.  Procedures Procedures (including critical care time)  Medications  Ordered in UC Medications - No data to display  Initial Impression / Assessment and Plan / UC Course  I have reviewed the triage vital signs and the nursing notes.  Pertinent labs & imaging results that were available during my care of the patient were reviewed by me and considered in my medical decision making (see chart for details).     Highly suspicious that patient could have electrolyte imbalance.  Do think patient needs prompt further evaluation and management as well as possible stat labs.  Do not have these capabilities here in urgent care so patient was advised to go to the emergency department for further evaluation and management.  Vital signs and patient stable at discharge.  Agree with patient's family member transporting him to the hospital. Final Clinical Impressions(s) / UC Diagnoses   Final diagnoses:  Contracture of muscle of both hands     Discharge Instructions      Go to the emergency department as soon as you leave urgent care for further evaluation and management.    ED Prescriptions   None    PDMP not reviewed this encounter.   Teodora Medici, [place] 11/15/21 [phone]

## 2021-11-15 NOTE — ED Triage Notes (Signed)
Pt arrived POV from home c/o muscle spasms and tensing up on the left side of his body and now he cannot open his fingers on left hand. Pt feels a little different on the right but he said not as bad as the left.

## 2021-11-15 NOTE — ED Triage Notes (Signed)
Pt presents to uc with co of bilateral arm contractures and numbness. Pt reports it started in l arm after lifting heavy at work and has since progressed within 30 min or so. Denies pain

## 2021-11-15 NOTE — ED Provider Notes (Signed)
[facility] EMERGENCY DEPARTMENT Provider Note   CSN: [id number] Arrival date & time: 11/15/21  1255     History  Chief Complaint  Patient presents with   Spasms    Jesus Taylor is a 31 y.o. male. 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  It is worse on the left side than the right.  He went to urgent care and was recommended to come to the ED.  His symptoms have since resolved. He denies any recent increased physical activity, new medications, or recent illnesses.  Denies any prior history of significant muscle spasms.  Denies any trauma or injuries to the upper extremities. HPI     Home Medications Prior to Admission medications   Medication Sig Start Date End Date Taking? Authorizing Provider  cyclobenzaprine (FLEXERIL) 10 MG tablet Take 1 tablet (10 mg total) by mouth 2 (two) times daily as needed for up to 10 days for muscle spasms. 11/15/21 11/25/21 Yes Kela Millin, MD  magnesium 30 MG tablet Take 1 tablet (30 mg total) by mouth 2 (two) times daily for 4 days. 11/15/21 11/19/21 Yes Kela Millin, MD  potassium chloride (KLOR-CON) 10 MEQ tablet Take 2 tablets (20 mEq total) by mouth daily for 2 days. 11/15/21 11/17/21 Yes Kela Millin, MD  esomeprazole (NEXIUM 24HR) 20 MG capsule Take 1 capsule (20 mg total) by mouth daily at 12 noon. 10/08/20   Mayers, Cari S, PA-C  famotidine (PEPCID) 40 MG tablet Take 1 tablet (40 mg total) by mouth every evening. 08/31/19 08/30/20  Court Joy, PA-C  sucralfate (CARAFATE) 1 g tablet Take 1 tablet (1 g total) by mouth 4 (four) times daily. 08/31/19 09/30/19  Court Joy, PA-C  Vitamin D, Ergocalciferol, (DRISDOL) 1.25 MG (50000 UNIT) CAPS capsule Take 1 capsule (50,000 Units total) by mouth every 7 (seven) days. 10/09/20   Mayers, Cari S, PA-C      Allergies    Strawberry extract    Review of Systems   Review of Systems   Constitutional:  Negative for chills and fever.  HENT:  Negative for ear pain and sore throat.   Eyes:  Negative for pain and visual disturbance.  Respiratory:  Negative for cough and shortness of breath.   Cardiovascular:  Negative for chest pain and palpitations.  Gastrointestinal:  Negative for abdominal pain and vomiting.  Genitourinary:  Negative for dysuria and hematuria.  Musculoskeletal:  Positive for myalgias. Negative for arthralgias and back pain.  Skin:  Negative for color change and rash.  Neurological:  Negative for seizures and syncope.  All other systems reviewed and are negative.   Physical Exam Updated Vital Signs BP (!) 125/99 (BP Location: Right Arm)   Pulse 63   Temp 98.7 F (37.1 C) (Oral)   Resp 15   Ht 5\' 9"  (1.753 m)   Wt 76.7 kg   SpO2 100%   BMI 24.96 kg/m  Physical Exam Vitals and nursing note reviewed.  Constitutional:      General: He is not in acute distress.    Appearance: He is well-developed.  HENT:     Head: Normocephalic and atraumatic.  Eyes:     Conjunctiva/sclera: Conjunctivae normal.  Cardiovascular:     Rate and Rhythm: Normal rate and regular rhythm.     Heart sounds: No murmur heard. Pulmonary:     Effort: Pulmonary effort is normal. No respiratory distress.  Breath sounds: Normal breath sounds.  Abdominal:     Palpations: Abdomen is soft.     Tenderness: There is no abdominal tenderness.  Musculoskeletal:        General: No swelling, tenderness, deformity or signs of injury.     Cervical back: Neck supple.     Comments: Intact range of motion, strength, sensation, and pulses in bilateral upper and lower extremities Equal bilaterally  Skin:    General: Skin is warm and dry.     Capillary Refill: Capillary refill takes less than 2 seconds.  Neurological:     Mental Status: He is alert.  Psychiatric:        Mood and Affect: Mood normal.     ED Results / Procedures / Treatments   Labs (all labs ordered are  listed, but only abnormal results are displayed) Labs Reviewed  COMPREHENSIVE METABOLIC PANEL - Abnormal; Notable for the following components:      Result Value   Potassium 3.1 (*)    BUN 5 (*)    All other components within normal limits  CBC WITH DIFFERENTIAL/PLATELET - Abnormal; Notable for the following components:   WBC 11.2 (*)    Neutro Abs 8.4 (*)    All other components within normal limits  MAGNESIUM - Abnormal; Notable for the following components:   Magnesium 1.3 (*)    All other components within normal limits    EKG None  Radiology No results found.  Procedures Procedures    Medications Ordered in ED Medications  magnesium sulfate IVPB 2 g 50 mL (0 g Intravenous Stopped 11/15/21 1803)  lactated ringers bolus 1,000 mL (0 mLs Intravenous Stopped 11/15/21 1803)  potassium chloride SA (KLOR-CON M) CR tablet 40 mEq (40 mEq Oral Given 11/15/21 1705)  cyclobenzaprine (FLEXERIL) tablet 10 mg (10 mg Oral Given 11/15/21 1705)    ED Course/ Medical Decision Making/ A&P                           Medical Decision Making Risk OTC drugs. Prescription drug management.   Previously healthy 31 year old male presenting with bilateral upper extremity muscle spasms that occurred earlier today.  They resolved prior to arrival. Seen at urgent care and recommended to come to the ED.  Differential diagnosis includes dehydration, electrolyte abnormalities, AKI.  Labs reviewed and significant for hypomagnesemia and hypokalemia.  Magnesium 1.3 and potassium 3.1.  Patient given IV magnesium supplementation, potassium supplementation, IV fluids, and Flexeril.  Overall, concern for electrolyte abnormalities contributing to patient's symptoms today. Recommended p.o. magnesium and potassium supplementation continued over the next couple of days and lab recheck at primary care doctor later this week.  Patient agreeable to this plan.  Patient felt comfortable discharge at this time.   Strict return precautions given.  All questions answered.         Final Clinical Impression(s) / ED Diagnoses Final diagnoses:  Hypokalemia  Hypomagnesemia    Rx / DC Orders ED Discharge Orders          Ordered    magnesium 30 MG tablet  2 times daily        11/15/21 1749    potassium chloride (KLOR-CON) 10 MEQ tablet  Daily        11/15/21 1749    cyclobenzaprine (FLEXERIL) 10 MG tablet  2 times daily PRN        11/15/21 1749  Kela Millin, MD 11/15/21 Mallie Snooks    Vanetta Mulders, MD 11/22/21 [phone]
